# Patient Record
Sex: Male | Born: 1965 | Race: Black or African American | Hispanic: No | Marital: Single
Health system: Midwestern US, Community
[De-identification: ages and names within clinical notes are randomized; demographics above are authoritative.]

## PROBLEM LIST (undated history)

## (undated) DIAGNOSIS — D1803 Hemangioma of intra-abdominal structures: Secondary | ICD-10-CM

## (undated) DIAGNOSIS — K861 Other chronic pancreatitis: Secondary | ICD-10-CM

## (undated) DIAGNOSIS — Z86010 Personal history of colonic polyps: Secondary | ICD-10-CM

## (undated) DIAGNOSIS — K8689 Other specified diseases of pancreas: Secondary | ICD-10-CM

## (undated) HISTORY — DX: Other specified diseases of pancreas: K86.89

## (undated) HISTORY — DX: Personal history of colonic polyps: Z86.010

## (undated) HISTORY — PX: WISDOM TOOTH EXTRACTION: SHX21

## (undated) HISTORY — DX: Other chronic pancreatitis: K86.1

## (undated) HISTORY — DX: Hemangioma of intra-abdominal structures: D18.03

---

## 2007-10-14 ENCOUNTER — Emergency Department (HOSPITAL_COMMUNITY): Admission: EM | Admit: 2007-10-14 | Discharge: 2007-10-14 | Payer: Self-pay | Admitting: Emergency Medicine

## 2009-11-25 ENCOUNTER — Emergency Department (HOSPITAL_COMMUNITY): Admission: EM | Admit: 2009-11-25 | Discharge: 2009-11-25 | Payer: Self-pay | Admitting: Emergency Medicine

## 2009-11-27 ENCOUNTER — Emergency Department (HOSPITAL_COMMUNITY): Admission: EM | Admit: 2009-11-27 | Discharge: 2009-11-28 | Payer: Self-pay | Admitting: Emergency Medicine

## 2010-09-17 ENCOUNTER — Inpatient Hospital Stay (HOSPITAL_COMMUNITY): Payer: Managed Care, Other (non HMO)

## 2010-09-17 ENCOUNTER — Inpatient Hospital Stay (HOSPITAL_COMMUNITY)
Admission: EM | Admit: 2010-09-17 | Discharge: 2010-09-20 | DRG: 440 | Disposition: A | Discharge status: Home / Self Care | Payer: Managed Care, Other (non HMO) | Attending: Internal Medicine | Admitting: Internal Medicine

## 2010-09-17 DIAGNOSIS — D72829 Elevated white blood cell count, unspecified: Secondary | ICD-10-CM | POA: Diagnosis present

## 2010-09-17 DIAGNOSIS — R7309 Other abnormal glucose: Secondary | ICD-10-CM | POA: Diagnosis present

## 2010-09-17 DIAGNOSIS — K7689 Other specified diseases of liver: Secondary | ICD-10-CM | POA: Diagnosis present

## 2010-09-17 DIAGNOSIS — I10 Essential (primary) hypertension: Secondary | ICD-10-CM | POA: Diagnosis present

## 2010-09-17 DIAGNOSIS — K859 Acute pancreatitis without necrosis or infection, unspecified: Principal | ICD-10-CM | POA: Diagnosis present

## 2010-09-17 DIAGNOSIS — F1020 Alcohol dependence, uncomplicated: Secondary | ICD-10-CM | POA: Diagnosis present

## 2010-09-17 LAB — ETHANOL: Alcohol, Ethyl (B): <5 mg/dL (ref 0–10)

## 2010-09-17 LAB — DIFFERENTIAL
Eosinophils Absolute: 0.1 10*3/uL (ref 0.0–0.7)
Lymphocytes Relative: 19 % (ref 12–46)
Lymphs Abs: 2.8 10*3/uL (ref 0.7–4.0)
Monocytes Relative: 4 % (ref 3–12)
Neutro Abs: 11.4 10*3/uL — ABNORMAL HIGH (ref 1.7–7.7)

## 2010-09-17 LAB — CBC
Hemoglobin: 15.3 g/dL (ref 13.0–17.0)
MCH: 28.4 pg (ref 26.0–34.0)
RBC: 5.38 MIL/uL (ref 4.22–5.81)
RDW: 14.3 % (ref 11.5–15.5)

## 2010-09-17 LAB — COMPREHENSIVE METABOLIC PANEL
Calcium: 9.3 mg/dL (ref 8.4–10.5)
Chloride: 101 mEq/L (ref 96–112)
Creatinine, Ser: 1.04 mg/dL (ref 0.4–1.5)
GFR calc non Af Amer: >60 mL/min (ref >60)
Glucose, Bld: 169 mg/dL — ABNORMAL HIGH (ref 70–99)
Total Bilirubin: 0.8 mg/dL (ref 0.3–1.2)

## 2010-09-17 LAB — HEMOGLOBIN A1C
Hgb A1c MFr Bld: 5.4 % (ref <5.7)
Mean Plasma Glucose: 108 mg/dL (ref <117)

## 2010-09-18 LAB — COMPREHENSIVE METABOLIC PANEL
Alkaline Phosphatase: 72 U/L (ref 39–117)
Calcium: 8.8 mg/dL (ref 8.4–10.5)
GFR calc Af Amer: >60 mL/min (ref >60)
Potassium: 4.3 mEq/L (ref 3.5–5.1)

## 2010-09-18 LAB — CBC
HCT: 41.7 % (ref 39.0–52.0)
Hemoglobin: 13.9 g/dL (ref 13.0–17.0)
MCH: 27.8 pg (ref 26.0–34.0)
MCHC: 33.3 g/dL (ref 30.0–36.0)
Platelets: 208 10*3/uL (ref 150–400)
RBC: 5 MIL/uL (ref 4.22–5.81)
WBC: 14.8 10*3/uL — ABNORMAL HIGH (ref 4.0–10.5)

## 2010-09-18 LAB — LIPASE, BLOOD: Lipase: 609 U/L — ABNORMAL HIGH (ref 11–59)

## 2010-09-19 LAB — BASIC METABOLIC PANEL
Calcium: 8.7 mg/dL (ref 8.4–10.5)
Chloride: 104 mEq/L (ref 96–112)
Creatinine, Ser: 0.76 mg/dL (ref 0.4–1.5)
GFR calc Af Amer: >60 mL/min (ref >60)

## 2010-09-19 LAB — LIPASE, BLOOD: Lipase: 140 U/L — ABNORMAL HIGH (ref 11–59)

## 2010-09-19 NOTE — H&P (Signed)
NAME:  Joseph Hess, Joseph Hess NO.:  [id number]  MEDICAL RECORD NO.:  [id number]           PATIENT TYPE:  I  LOCATION:  1312                         FACILITY:  [facility]  PHYSICIAN:  Hillery Aldo, M.D.   DATE OF BIRTH:  1966-03-19  DATE OF ADMISSION:  09/17/2010 DATE OF DISCHARGE:                             HISTORY & PHYSICAL   PRIMARY CARE PHYSICIAN:  None.  CHIEF COMPLAINT:  Abdominal pain accompanied by nausea and vomiting.  HISTORY OF PRESENT ILLNESS:  The patient is a 45 year old male with no significant past medical history, but a history of heavy alcohol use who presents to the hospital with a chief complaint of intermittent abdominal pain.  The patient describes pain as being present for the past several weeks, but worsening in intensity and duration.  The pain is described as sharp and rated 10/10 at its worst.  No aggravating or alleviating factors; however, he has had some associated nausea and vomiting over the past 24 hours and thinks that eating may have aggravated the pain.  There is no frank hematemesis and no diarrhea.  He denies any fever or sick contacts.  He has no history of similar pain. Upon initial evaluation by the emergency department physician, the patient was found to have a lipase level of 350 and was subsequently referred to the hospitalist service for treatment of acute pancreatitis.  PAST MEDICAL HISTORY: 1. Traumatic fracture of nasal bone and maxilla, July 2011. 2. Traumatic boxer's fracture, right 5th finger, May 2009.  FAMILY HISTORY:  No family history of pancreatitis.  The patient's parents are both living.  Mother has hypertension and dyslipidemia.  The patient's father is diabetic.  SOCIAL HISTORY:  The patient is married and lives in [place] with his wife.  He smokes 5-6 cigarettes a day and admits to 10 beers per day on average.  He does also use marijuana, but denies any heavy drug use of other varieties.  He is  currently unemployed.  His wife can be reached at [phone] and his mother can be reached at [phone].  He indicates that these 2 individuals can make medical decisions on his behalf should he become incapacitated.  ALLERGIES:  PENICILLIN.  CURRENT MEDICATIONS:  Denies any current medications including herbs and over-the-counter supplements.  REVIEW OF SYSTEMS:  CONSTITUTIONAL:  Diminished appetite, but no weight changes.  HEENT:  No complaints.  CARDIOVASCULAR:  No chest pain or dysrhythmia.  RESPIRATORY:  No dyspnea or cough.  GI:  As noted in the elements of the HPI above.  No melena or hematochezia.  GU:  No dysuria or hematuria.  Comprehensive 14-point review of systems is otherwise unremarkable.  PHYSICAL EXAMINATION:  VITAL SIGNS:  Temperature 97.5, pulse 102, respirations 22, blood pressure 150/100, O2 saturation 100% on room air. GENERAL:  A well-developed, well-nourished African American male in no acute distress. HEENT:  Normocephalic, atraumatic.  PERRL.  EOMI.  Oropharynx is clear with metallic dental caps on his teeth.  Mucous membranes are moist. NECK:  Supple.  No thyromegaly, no lymphadenopathy, no jugular venous distention. CHEST:  Lungs clear to auscultation bilaterally with good air  movement. HEART:  Tachycardic rate, regular rhythm.  No murmurs, rubs, or gallops. ABDOMEN:  Soft, diffusely tender.  Bowel sounds present in all 4 quadrants. EXTREMITIES:  No clubbing, edema, or cyanosis. SKIN:  Warm and dry.  No rashes. NEUROLOGIC:  The patient is alert and oriented x3.  Cranial nerves II through XII are grossly intact.  Nonfocal.  LABORATORY DATA REVIEW:  Lipase is 350.  Alcohol was less than 5. Sodium is 136, potassium 3.6, chloride 101, bicarb 21, BUN 7, creatinine 1.04, glucose 169, calcium 9.3, total bilirubin 0.8, alkaline phosphatase 83, AST 65, ALT 49, total protein 7.8, albumin 4.3.  White blood cell count is 15, hemoglobin 15.3, hematocrit  44.7, platelets 295.  ASSESSMENT/PLAN: 1. Acute pancreatitis, most likely alcohol related, however, we will     rule out cholelithiasis with an abdominal ultrasound.  The patient     has been advised that this is most likely alcohol-related and that     he will need to cut down or completely discontinue alcohol use to     prevent this from recurring.  The patient will be made n.p.o.     except for ice chips and he has been explained that eating or     taking in caloric beverages is contraindicated at this time.  We     will provide him with pain medications and antiemetics and slowly     advance his diet once his pain is improved. 2. Alcoholism:  The patient will be placed on Ativan per the [facility]WA     protocol.  He denies any prior history of withdrawal type symptoms;     however, it is likely that he will go through some withdrawal,     given the amount of alcohol he consumes on a daily basis.  We will     supplement him with thiamine and folic acid. 3. Hyperglycemia, likely secondary to pancreatic dysfunction, but will     check hemoglobin A1c to ensure that he has no evidence of     underlying diabetes. 4. Leukocytosis, likely due to inflammation from pancreatitis.  We     will monitor his white blood cell count closely. 5. Hypertension:  Could be essential hypertension or effects of     beginning to withdraw from alcohol.  We will put him on p.o.     clonidine b.i.d. and monitor his response. 6. Prophylaxis:  The patient is ambulatory and at low risk for deep     venous thrombosis, so we will use PAS hoses for deep venous     thrombosis prophylaxis.  Time spent on admission including face-to-face time equals approximately 1 hour.     Hillery Aldo, M.D.     CR/MEDQ  D:  09/17/2010  T:  09/17/2010  Job:  [id number]  Electronically Signed by Hillery Aldo M.D. on 09/19/2010 07:04:42 AM

## 2010-09-20 LAB — CBC
HCT: 36.1 % — ABNORMAL LOW (ref 39.0–52.0)
Hemoglobin: 11.9 g/dL — ABNORMAL LOW (ref 13.0–17.0)
MCH: 27.5 pg (ref 26.0–34.0)
MCHC: 33 g/dL (ref 30.0–36.0)
MCV: 83.4 fL (ref 78.0–100.0)
Platelets: 164 10*3/uL (ref 150–400)
WBC: 12.4 10*3/uL — ABNORMAL HIGH (ref 4.0–10.5)

## 2010-09-20 LAB — COMPREHENSIVE METABOLIC PANEL
Alkaline Phosphatase: 100 U/L (ref 39–117)
BUN: 4 mg/dL — ABNORMAL LOW (ref 6–23)
Chloride: 104 mEq/L (ref 96–112)
Creatinine, Ser: 0.76 mg/dL (ref 0.4–1.5)
GFR calc non Af Amer: >60 mL/min (ref >60)
Potassium: 4 mEq/L (ref 3.5–5.1)
Sodium: 136 mEq/L (ref 135–145)
Total Bilirubin: 0.6 mg/dL (ref 0.3–1.2)

## 2010-09-21 NOTE — Discharge Summary (Signed)
NAME:  Joseph Hess, Joseph Hess NO.:  [id number]  MEDICAL RECORD NO.:  [id number]           PATIENT TYPE:  I  LOCATION:  1312                         FACILITY:  [facility]  PHYSICIAN:  Brendia Sacks, MD    DATE OF BIRTH:  17-Oct-1965  DATE OF ADMISSION:  09/17/2010 DATE OF DISCHARGE:  09/20/2010                              DISCHARGE SUMMARY   PRIMARY CARE PHYSICIAN:  None.  The patient will be establishing with primary care physician of his choice.  CONDITION ON DISCHARGE:  Improved.  DISCHARGE DIAGNOSES: 1. Acute alcohol-induced pancreatitis. 2. Alcohol abuse/dependency, stable.  No evidence of withdrawal. 3. Hepatic steatosis. 4. Hypertension, stable.  HISTORY OF PRESENT ILLNESS:  This is a 45 year old male who presented to the emergency room on September 17, 2010, with abdominal pain associated with nausea and vomiting who was admitted for treatment for acute pancreatitis.  HOSPITAL COURSE: 1. Acute alcohol-induced pancreatitis.  The patient was admitted to     the medical floor.  He was supportive care for his pancreatitis.     His symptoms have resolved at this point.  He has no abdominal     pain, no nausea or vomiting, and he is eating well.  He has not     required any narcotics in greater than 24 hours now.  His lipase is     trending down towards normal.  He appears to be stable for     discharge.  He will be discharged home on a heart-healthy bland     diet.  He has been counseled on the association of alcoholic     pancreatitis. 2. Alcohol abuse dependency.  I counseled him on complete cessation.     Certainly absolutely no more than 2 drinks in the 24-hour period.     He has also been seen by social work and has refused any additional     services.  He is thinking about maybe findings some counseling. 3. Hepatic steatosis.  This can be followed in the outpatient setting     as clinically indicated.  Hopefully he will improve off alcohol.     His  transaminases are within normal limits. 4. Hypertension.  This appears to be stable.  He has been started on     clonidine both for his hypertension and given his alcohol use.  At     this point, his blood pressure appears to be stable.  He does have     a history of hypertension which has been untreated.  At this point,     I think it is reasonable to continue him on clonidine and follow up     with primary care physician in the outpatient setting.  Should he     discontinue his alcohol use, could consider transitioning him to a     diuretic and a second agent as needed.  If the patient does     continue to work on alcohol cessation clonidine may be useful for     his blood pressure control in the interim as it appears on the     current dosing of  clonidine his blood pressure is stable.  CONSULTATIONS:  None.  PROCEDURES:  None.  IMAGING:  Abdominal ultrasound, September 17, 2010:  No cholelithiasis or choledocholithiasis identified.  No biliary dilatation.  Echogenic liver suggested hepatic steatosis.  Pancreas obscured by bowel gas potentially partially due to pancreatitis.  MICROBIOLOGY:  None.  PERTINENT LABORATORY STUDIES: 1. Serum alcohol on admission was negative. 2. Serum lipase on admission was 350. 3. Hemoglobin A1c was 5.4. 4. Discharge lipase is 99. 5. CBC notable for white blood cell count 15.0 on admission, trending     downwards and 12.4 on discharge.  Hemoglobin 11.9 on discharge.     His hemoglobin of 15.3 on admission was probably related to     hemoconcentration and dehydration.  The patient has had no evidence     of bleeding, his suspected baseline hemoglobin is around 12. 6. Basic metabolic panel unremarkable on discharge. 7. Hepatic function panel unremarkable on discharge.  He did have an     elevation of AST of 265 on admission consistent with alcohol use.     His ALT has remained within normal limits. 8. Lipase 350 on admission, 99 on  discharge.  DISCHARGE PHYSICAL EXAMINATION:  GENERAL:  The patient is feeling well. No nausea or vomiting.  He is eating well. VITAL SIGNS:  Temperature is 99.1, pulse 76, respirations 18, blood pressure 162/97, 100% on room air. CARDIOVASCULAR:  Regular rate and rhythm.  No murmur, rub, or gallop. RESPIRATORY SYSTEM:  Respirations are clear to auscultation bilaterally. No wheezes, rales or rhonchi. ABDOMEN:  Soft, nontender, nondistended.  The patient appears well.  DISCHARGE INSTRUCTIONS:  The patient will be discharged home today.  He should continue on a heart-healthy bland diet.  Activity is unrestricted.  I recommend he discontinue smoking and recommend complete alcohol cessation.  Absolutely no more than 2 alcoholic drinks in a 24- hour period.  I recommend Alcoholics Anonymous.  I recommended no illicit drug use.  To follow up with primary care physician of his choice in 2 weeks.  He has been counseled on connection of alcohol with pancreatitis.  Things to follow up in the outpatient setting: 1. Continue counseling on alcohol. 2. Continued recommendations for smoking cessation and currently     illicit drug. 3. Hypertension.  See discussion above.  Time coordinating discharge is 30 minutes.    Brendia Sacks, MD    DG/MEDQ  D:  09/20/2010  T:  09/20/2010  Job:  [id number]  Electronically Signed by Brendia Sacks  on 09/21/2010 05:55:09 PM

## 2011-03-05 ENCOUNTER — Emergency Department (HOSPITAL_COMMUNITY)
Admission: EM | Admit: 2011-03-05 | Discharge: 2011-03-06 | Disposition: A | Discharge status: Home / Self Care | Payer: Managed Care, Other (non HMO) | Attending: Emergency Medicine | Admitting: Emergency Medicine

## 2011-03-05 DIAGNOSIS — K859 Acute pancreatitis without necrosis or infection, unspecified: Secondary | ICD-10-CM | POA: Insufficient documentation

## 2011-03-05 DIAGNOSIS — F17200 Nicotine dependence, unspecified, uncomplicated: Secondary | ICD-10-CM | POA: Insufficient documentation

## 2011-03-05 DIAGNOSIS — F1010 Alcohol abuse, uncomplicated: Secondary | ICD-10-CM | POA: Insufficient documentation

## 2011-03-05 LAB — DIFFERENTIAL
Basophils Absolute: 0 10*3/uL (ref 0.0–0.1)
Eosinophils Absolute: 0 10*3/uL (ref 0.0–0.7)
Lymphs Abs: 1.1 10*3/uL (ref 0.7–4.0)
Neutro Abs: 14.9 10*3/uL — ABNORMAL HIGH (ref 1.7–7.7)
Neutrophils Relative %: 88 % — ABNORMAL HIGH (ref 43–77)

## 2011-03-05 LAB — CBC
Hemoglobin: 15.7 g/dL (ref 13.0–17.0)
MCH: 28.9 pg (ref 26.0–34.0)
MCHC: 34.8 g/dL (ref 30.0–36.0)
RDW: 14 % (ref 11.5–15.5)
WBC: 16.9 10*3/uL — ABNORMAL HIGH (ref 4.0–10.5)

## 2011-03-05 LAB — COMPREHENSIVE METABOLIC PANEL
ALT: 27 U/L (ref 0–53)
AST: 24 U/L (ref 0–37)
Albumin: 4.8 g/dL (ref 3.5–5.2)
BUN: 8 mg/dL (ref 6–23)
CO2: 26 mEq/L (ref 19–32)
Calcium: 10.5 mg/dL (ref 8.4–10.5)
Creatinine, Ser: 0.79 mg/dL (ref 0.50–1.35)
Glucose, Bld: 154 mg/dL — ABNORMAL HIGH (ref 70–99)
Sodium: 137 mEq/L (ref 135–145)

## 2011-03-05 LAB — LIPASE, BLOOD: Lipase: 579 U/L — ABNORMAL HIGH (ref 11–59)

## 2011-10-03 ENCOUNTER — Emergency Department (HOSPITAL_COMMUNITY)
Admission: EM | Admit: 2011-10-03 | Discharge: 2011-10-03 | Disposition: A | Discharge status: Home / Self Care | Payer: Self-pay | Attending: Emergency Medicine | Admitting: Emergency Medicine

## 2011-10-03 ENCOUNTER — Encounter (HOSPITAL_COMMUNITY): Payer: Self-pay | Admitting: Emergency Medicine

## 2011-10-03 DIAGNOSIS — R1110 Vomiting, unspecified: Secondary | ICD-10-CM | POA: Insufficient documentation

## 2011-10-03 DIAGNOSIS — M545 Low back pain, unspecified: Secondary | ICD-10-CM | POA: Insufficient documentation

## 2011-10-03 DIAGNOSIS — R1013 Epigastric pain: Secondary | ICD-10-CM | POA: Insufficient documentation

## 2011-10-03 DIAGNOSIS — K859 Acute pancreatitis without necrosis or infection, unspecified: Secondary | ICD-10-CM | POA: Insufficient documentation

## 2011-10-03 LAB — COMPREHENSIVE METABOLIC PANEL
ALT: 28 U/L (ref 0–53)
AST: 23 U/L (ref 0–37)
Alkaline Phosphatase: 95 U/L (ref 39–117)
CO2: 23 mEq/L (ref 19–32)
Chloride: 98 mEq/L (ref 96–112)
GFR calc non Af Amer: >90 mL/min (ref >90)
Glucose, Bld: 95 mg/dL (ref 70–99)
Sodium: 135 mEq/L (ref 135–145)
Total Bilirubin: 0.5 mg/dL (ref 0.3–1.2)

## 2011-10-03 LAB — URINALYSIS, ROUTINE W REFLEX MICROSCOPIC
Hgb urine dipstick: NEGATIVE
Ketones, ur: 40 mg/dL — AB
Nitrite: NEGATIVE
Specific Gravity, Urine: 1.026 (ref 1.005–1.030)
pH: 6 (ref 5.0–8.0)

## 2011-10-03 LAB — CBC
Hemoglobin: 15.9 g/dL (ref 13.0–17.0)
Platelets: 280 10*3/uL (ref 150–400)
RBC: 5.65 MIL/uL (ref 4.22–5.81)
WBC: 10.4 10*3/uL (ref 4.0–10.5)

## 2011-10-03 MED ORDER — PROMETHAZINE HCL 25 MG PO TABS: 25.0000 mg | ORAL_TABLET | Freq: Four times a day (QID) | ORAL | Status: DC | PRN

## 2011-10-03 MED ORDER — MORPHINE SULFATE 4 MG/ML IJ SOLN
4.0000 mg | Freq: Once | INTRAMUSCULAR | Status: AC
  Administered 2011-10-03: 4 mg via INTRAVENOUS
  Filled 2011-10-03: qty 1

## 2011-10-03 MED ORDER — SODIUM CHLORIDE 0.9 % IV BOLUS (SEPSIS)
1000.0000 mL | Freq: Once | INTRAVENOUS | Status: AC
  Administered 2011-10-03: 1000 mL via INTRAVENOUS

## 2011-10-03 MED ORDER — ONDANSETRON HCL 4 MG/2ML IJ SOLN
4.0000 mg | Freq: Once | INTRAMUSCULAR | Status: AC
  Administered 2011-10-03: 4 mg via INTRAVENOUS
  Filled 2011-10-03: qty 2

## 2011-10-03 MED ORDER — OXYCODONE-ACETAMINOPHEN 5-325 MG PO TABS: 1.0000 | ORAL_TABLET | ORAL | Status: AC | PRN

## 2011-10-03 NOTE — ED Provider Notes (Signed)
History     CSN: [id number]  Arrival date & time 10/03/11  1133   First MD Initiated Contact with Patient 10/03/11 1207      Chief Complaint  Patient presents with  . Back Pain    low back pain x 24 hrs  . Emesis     vomited x 4 in last 24 hrs    (Consider location/radiation/quality/duration/timing/severity/associated sxs/prior treatment) The history is provided by the patient.  46 y/o M with PMH heavy alcohol use, pancreatitis presents to ED with c/c of lower back pain and several episodes of emesis in the last 24 hours. Emesis non-bloody, non-bilious. Last episode around 7am today. No assoc abd pain. Back pain worst to the left paralumbar region with radiation to the left side. Loose, non-watery, non-bloody stool this am. Denies any dysuria, hematuria, or penile discharge. Denies fever, chills, CP, SOB. Movement makes back pain worse, nothing makes it better. No attempted tx PTA.  History reviewed. No pertinent past medical history.  History reviewed. No pertinent past surgical history.  Family History  Problem Relation Age of Onset  . Hypertension Mother   . Cancer Mother     History  Substance Use Topics  . Smoking status: Current Everyday Smoker    Types: Cigarettes  . Smokeless tobacco: Not on file  . Alcohol Use: Yes      Review of Systems 10 systems reviewed and are negative for acute change except as noted in the HPI.  Allergies  Penicillins  Home Medications   Current Outpatient Rx  Name Route Sig Dispense Refill  . ACETAMINOPHEN-ASPIRIN BUFFERED 250-250 MG PO TABS Oral Take 2 tablets by mouth every 4 (four) hours as needed. Back pain      BP 164/88  Pulse 99  Temp(Src) 98.9 F (37.2 C) (Oral)  SpO2 100%  Physical Exam  Constitutional: He appears well-developed and well-nourished.       Vital signs are reviewed and are normal except for HTN. Uncomfortable appearing.   HENT:  Head: Normocephalic and atraumatic.  Right Ear: External ear  normal.  Left Ear: External ear normal.       Oral mucosa moist  Eyes: Pupils are equal, round, and reactive to light.  Neck: Neck supple.  Cardiovascular: Normal rate, regular rhythm and normal heart sounds.   No murmur heard.      Bilateral radial and DP pulses are 2+   Pulmonary/Chest: Effort normal and breath sounds normal. No respiratory distress. He has no wheezes. He has no rales. He exhibits no tenderness.  Abdominal: Soft. Bowel sounds are normal. He exhibits no distension. There is tenderness in the epigastric area. There is no rebound, no guarding and no CVA tenderness.    Musculoskeletal: Normal range of motion. He exhibits tenderness. He exhibits no edema.       Cervical back: He exhibits no tenderness and no bony tenderness.       Thoracic back: He exhibits no tenderness and no bony tenderness.       Lumbar back: He exhibits no bony tenderness.       Back:  Neurological: He is alert.       MS appropriate for pt and situation. Speech clear. 5/5 and symmetric strength to BLE. Normal gait. Sensation intact to light touch and symmetric in BLE.  Skin: Skin is warm and dry. No rash noted.    ED Course  Procedures (including critical care time)  Labs Reviewed  URINALYSIS, ROUTINE W REFLEX MICROSCOPIC - Abnormal; Notable  for the following:    Bilirubin Urine SMALL (*)    Ketones, ur 40 (*)    All other components within normal limits  LIPASE, BLOOD - Abnormal; Notable for the following:    Lipase 243 (*)    All other components within normal limits  CBC  COMPREHENSIVE METABOLIC PANEL  ETHANOL   No results found.   Dx 1: Pancreatitis Dx 2: Low back pain   MDM  Exam sig for left paralumbar pain, epigastric pain. Labs reviewed, sig for elevated lipase, ketonuria. C/w pancreatitis and mild dehydration. Pt tx with single dose nausea, pain medication in ED and fluid bolus with near resolution of pain/nausea. Discussed admission vs d./c home for recovery. Stressed  importance of alcohol use cessation until symptoms completely resolved, pt reports not having any alcohol in last 24 hours without s/s of withdrawal and would rather tx self at home with pain and nausea medication. Regarding back pain, not c/w kidney stone and no hematuria. Exam most c/w muscular pain. Return precautions and all results discussed with pt and spouse, who voice understanding.        Shaaron Adler, PA-C 10/03/11 1529

## 2011-10-03 NOTE — ED Provider Notes (Signed)
Medical screening examination/treatment/procedure(s) were performed by non-physician practitioner and as supervising physician I was immediately available for consultation/collaboration. Devoria Albe, MD, FACEP   Ward Givens, MD 10/03/11 1535

## 2011-10-03 NOTE — Discharge Instructions (Signed)
Acute Pancreatitis The pancreas is a large gland located behind your stomach. It produces (secretes) enzymes. These enzymes help digest food. It also releases the hormones glucagon and insulin. These hormones help regulate blood sugar. When the pancreas becomes inflamed, the disease is called pancreatitis. Inflammation of the pancreas occurs when enzymes from the pancreas begin attacking and digesting the pancreas. CAUSES  Most cases ofsudden onset (acute) pancreatitis are caused by:  Alcohol abuse.   Gallstones.  Other less common causes are:  Some medications.   Exposure to certain chemicals   Infection.   Damage caused by an accident (trauma).   Surgery of the belly (abdomen).  SYMPTOMS  Acute pancreatitis usually begins with pain in the upper abdomen and may radiate to the back. This pain may last a couple days. The constant pain varies from mild to severe. The acute form of this disease may vary from mild, nonspecific abdominal pain to profound shock with coma. About 1 in 5 cases are severe. These patients become dehydrated and develop low blood pressure. In severe cases, bleeding into the pancreas can lead to shock and death. The lungs, heart, and kidneys may fail. DIAGNOSIS  Your caregiver will form a clinical opinion after giving you an exam. Laboratory work is used to confirm this diagnosis. Often,a digestive enzyme from the pancreas (serum amylase) and other enzymes are elevated. Sugars and fats (lipids) in the blood may be elevated. There may also be changes in the following levels: calcium, magnesium, potassium, chloride and bicarbonate (chemicals in the blood). X-rays, a CT scan, or ultrasound of your abdomen may be necessary to search for other causes of your abdominal pain. TREATMENT  Most pancreatitis requires treatment of symptoms. Most acute attacks last a couple of days. Your caregiver can discuss the treatment options with you.  If complications occur, hospitalization  may be necessary for pain control and intravenous (IV) fluid replacement.   Sometimes, a tube may be put into the stomach to control vomiting.   Food may not be allowed for 3 to 4 days. This gives the pancreas time to rest. Giving the pancreas a rest means there is no stimulation that would produce more enzymes and cause more damage.   Medicines (antibiotics) that kill germs may be given if infection is the cause.   Sometimes, surgery may be required.   Following an acute attack, your caregiver will determine the cause, if possible, and offer suggestions to prevent recurrences.  HOME CARE INSTRUCTIONS   Eat smaller, more frequent meals. This reduces the amount of digestive juices the pancreas produces.   Decrease the amount of fat in your diet. This may help reduce loose, diarrheal stools.   Drink enough water and fluids to keep your urine clear or pale yellow. This is to avoid dehydration which can cause increased pain.   Talk to your caregiver about pain relievers or other medicines that may help.   Avoid anything that may have triggered your pancreatitis (for example, alcohol).   Follow the diet advised by your caregiver. Do not advance the diet too soon.   Take medicines as prescribed.   Get plenty of rest.   Check your blood sugar at home as directed by your caregiver.   If your caregiver has given you a follow-up appointment, it is very important to keep that appointment. Not keeping the appointment could result in a lasting (chronic) or permanent injury, pain, and disability. If there is any problem keeping the appointment, you must call to reschedule.    SEEK MEDICAL CARE IF:   You are not recovering in the time described by your caregiver.   You have persistent pain, weakness, or feel sick to your stomach (nauseous).   You have recovered and then have another bout of pain.  SEEK IMMEDIATE MEDICAL CARE IF:   You are unable to eat or keep fluids down.   Your pain  increases a lot or changes.   You have an oral temperature above 102 F (38.9 C), not controlled by medicine.   Your skin or the white part of your eyes look yellow (jaundice).   You develop vomiting.   You feel dizzy or faint.   Your blood sugar is high (over 300).  MAKE SURE YOU:   Understand these instructions.   Will watch your condition.   Will get help right away if you are not doing well or get worse.  Document Released: 05/07/2005 Document Revised: 04/26/2011 Document Reviewed: 12/19/2007 [facility] Patient Information 2012 [place], [place].          Back Pain, Adult Low back pain is very common. About 1 in 5 people have back pain.The cause of low back pain is rarely dangerous. The pain often gets better over time.About half of people with a sudden onset of back pain feel better in just 2 weeks. About 8 in 10 people feel better by 6 weeks.  CAUSES Some common causes of back pain include:  Strain of the muscles or ligaments supporting the spine.   Wear and tear (degeneration) of the spinal discs.   Arthritis.   Direct injury to the back.  DIAGNOSIS Most of the time, the direct cause of low back pain is not known.However, back pain can be treated effectively even when the exact cause of the pain is unknown.Answering your caregiver's questions about your overall health and symptoms is one of the most accurate ways to make sure the cause of your pain is not dangerous. If your caregiver needs more information, he or she may order lab work or imaging tests (X-rays or MRIs).However, even if imaging tests show changes in your back, this usually does not require surgery. HOME CARE INSTRUCTIONS For many people, back pain returns.Since low back pain is rarely dangerous, it is often a condition that people can learn to [facility] their own.   Remain active. It is stressful on the back to sit or stand in one place. Do not sit, drive, or stand in one place for more than 30  minutes at a time. Take short walks on level surfaces as soon as pain allows.Try to increase the length of time you walk each day.   Do not stay in bed.Resting more than 1 or 2 days can delay your recovery.   Do not avoid exercise or work.Your body is made to move.It is not dangerous to be active, even though your back may hurt.Your back will likely heal faster if you return to being active before your pain is gone.   Pay attention to your body when you bend and lift. Many people have less discomfortwhen lifting if they bend their knees, keep the load close to their bodies,and avoid twisting. Often, the most comfortable positions are those that put less stress on your recovering back.   Find a comfortable position to sleep. Use a firm mattress and lie on your side with your knees slightly bent. If you lie on your back, put a pillow under your knees.   Only take over-the-counter or prescription medicines as directed  by your caregiver. Over-the-counter medicines to reduce pain and inflammation are often the most helpful.Your caregiver may prescribe muscle relaxant drugs.These medicines help dull your pain so you can more quickly return to your normal activities and healthy exercise.   Put ice on the injured area.   Put ice in a plastic bag.   Place a towel between your skin and the bag.   Leave the ice on for 15 to 20 minutes, 3 to 4 times a day for the first 2 to 3 days. After that, ice and heat may be alternated to reduce pain and spasms.   Ask your caregiver about trying back exercises and gentle massage. This may be of some benefit.   Avoid feeling anxious or stressed.Stress increases muscle tension and can worsen back pain.It is important to recognize when you are anxious or stressed and learn ways to manage it.Exercise is a great option.  SEEK MEDICAL CARE IF:  You have pain that is not relieved with rest or medicine.   You have pain that does not improve in 1 week.    You have new symptoms.   You are generally not feeling well.  SEEK IMMEDIATE MEDICAL CARE IF:   You have pain that radiates from your back into your legs.   You develop new bowel or bladder control problems.   You have unusual weakness or numbness in your arms or legs.   You develop nausea or vomiting.   You develop abdominal pain.   You feel faint.  Document Released: 05/07/2005 Document Revised: 04/26/2011 Document Reviewed: 09/25/2010 [facility] Patient Information 2012 [place], [place].

## 2011-10-03 NOTE — ED Notes (Signed)
Pt reports low back pain and vomiting x 24 hrs. Denies nausea at present

## 2011-10-03 NOTE — ED Notes (Signed)
Pt reports lower back pain and L flank pain that started yesterday at 0500.  Pt reports n/v and mild diarrhea early this am.  Pt reports drinking etoh daily.  Pt reports drinking "sparks" x 4 a day-16oz daily.  Pt has hx of pancreatitis.  Pt denies any urinary sxs at this time.

## 2011-11-29 ENCOUNTER — Emergency Department (HOSPITAL_COMMUNITY)
Admission: EM | Admit: 2011-11-29 | Discharge: 2011-11-29 | Disposition: A | Discharge status: Home / Self Care | Payer: BC Managed Care – PPO | Attending: Emergency Medicine | Admitting: Emergency Medicine

## 2011-11-29 ENCOUNTER — Emergency Department (HOSPITAL_COMMUNITY): Payer: BC Managed Care – PPO

## 2011-11-29 ENCOUNTER — Encounter (HOSPITAL_COMMUNITY): Payer: Self-pay | Admitting: *Deleted

## 2011-11-29 DIAGNOSIS — K859 Acute pancreatitis without necrosis or infection, unspecified: Secondary | ICD-10-CM

## 2011-11-29 DIAGNOSIS — F17200 Nicotine dependence, unspecified, uncomplicated: Secondary | ICD-10-CM | POA: Insufficient documentation

## 2011-11-29 LAB — COMPREHENSIVE METABOLIC PANEL
AST: 26 U/L (ref 0–37)
CO2: 22 mEq/L (ref 19–32)
Calcium: 9.5 mg/dL (ref 8.4–10.5)
Creatinine, Ser: 0.74 mg/dL (ref 0.50–1.35)
GFR calc Af Amer: >90 mL/min (ref >90)
GFR calc non Af Amer: >90 mL/min (ref >90)
Total Protein: 7.8 g/dL (ref 6.0–8.3)

## 2011-11-29 LAB — CBC WITH DIFFERENTIAL/PLATELET
Basophils Absolute: 0 10*3/uL (ref 0.0–0.1)
Eosinophils Absolute: 0 10*3/uL (ref 0.0–0.7)
Eosinophils Relative: 0 % (ref 0–5)
HCT: 43 % (ref 39.0–52.0)
Lymphocytes Relative: 9 % — ABNORMAL LOW (ref 12–46)
MCH: 27.9 pg (ref 26.0–34.0)
MCHC: 34 g/dL (ref 30.0–36.0)
MCV: 82.2 fL (ref 78.0–100.0)
Monocytes Absolute: 0.6 10*3/uL (ref 0.1–1.0)
RDW: 13.4 % (ref 11.5–15.5)

## 2011-11-29 LAB — OCCULT BLOOD, POC DEVICE: Fecal Occult Bld: NEGATIVE

## 2011-11-29 MED ORDER — ONDANSETRON HCL 4 MG PO TABS: 4.0000 mg | ORAL_TABLET | Freq: Four times a day (QID) | ORAL | Status: AC

## 2011-11-29 MED ORDER — GI COCKTAIL ~~LOC~~
30.0000 mL | Freq: Once | ORAL | Status: AC
  Administered 2011-11-29: 30 mL via ORAL
  Filled 2011-11-29: qty 30

## 2011-11-29 MED ORDER — ONDANSETRON HCL 4 MG/2ML IJ SOLN
4.0000 mg | Freq: Once | INTRAMUSCULAR | Status: AC
  Administered 2011-11-29: 4 mg via INTRAVENOUS
  Filled 2011-11-29: qty 2

## 2011-11-29 MED ORDER — HYDROMORPHONE HCL PF 1 MG/ML IJ SOLN
1.0000 mg | Freq: Once | INTRAMUSCULAR | Status: AC
  Administered 2011-11-29: 1 mg via INTRAVENOUS
  Filled 2011-11-29: qty 1

## 2011-11-29 MED ORDER — SODIUM CHLORIDE 0.9 % IV BOLUS (SEPSIS)
1000.0000 mL | Freq: Once | INTRAVENOUS | Status: AC
  Administered 2011-11-29: 1000 mL via INTRAVENOUS

## 2011-11-29 MED ORDER — HYDROCODONE-ACETAMINOPHEN 5-500 MG PO TABS: 1.0000 | ORAL_TABLET | Freq: Four times a day (QID) | ORAL | Status: AC | PRN

## 2011-11-29 NOTE — ED Notes (Addendum)
Pt states he has a hx of pancreatitis and has been throwing up blood since 11a today.  Reports left side abd pain since 4am.

## 2011-11-29 NOTE — ED Provider Notes (Signed)
History     CSN: [id number]  Arrival date & time 11/29/11  1331   First MD Initiated Contact with Patient 11/29/11 1414      Chief Complaint  Patient presents with  . Abdominal Pain    (Consider location/radiation/quality/duration/timing/severity/associated sxs/prior treatment) HPI  Patient presents to emergency department complaining of abdominal pain that he states is similar to his abdominal pain that he's had numerous times in the past associated with pancreatitis. He is also complaining of acute onset of vomiting up bright red blood. Patient states that he woke at 3 AM and vomited and woke again a few hours later and vomited however after the second occurrence of bright red blood mixed in the vomit. Patient states that he had gradual onset abdominal discomfort that began yesterday however has worsened throughout the night and has persisted today with severe pain. Patient states the pain is in his left upper quadrant and again consistent with his history of pancreatitis. Patient states that he did drink heavily on July 4th but did not drink since then. Patient states his pancreatitis is usually associated with his alcohol abuse. He denies any fevers, chills, chest pain, cough, hemoptysis, lower abdominal pain, diarrhea, or blood in his stool. Patient states that he takes medication for high blood pressure but takes no other medicines on regular basis. Patient is followed at [facility] as his primary care provider. Patient took nothing for symptoms prior to arrival. He denies aggravating or alleviating factors.  Past Medical History  Diagnosis Date  . Pancreatitis     History reviewed. No pertinent past surgical history.  Family History  Problem Relation Age of Onset  . Hypertension Mother   . Cancer Mother     History  Substance Use Topics  . Smoking status: Current Everyday Smoker    Types: Cigarettes  . Smokeless tobacco: Not on file  . Alcohol Use: No     pt  denies use in 2-3 months (11-29-11)      Review of Systems  All other systems reviewed and are negative.    Allergies  Penicillins  Home Medications   Current Outpatient Rx  Name Route Sig Dispense Refill  . IBUPROFEN 200 MG PO TABS Oral Take 400 mg by mouth every 6 (six) hours as needed. For pain      BP 166/98  Pulse 77  Temp 98.1 F (36.7 C) (Oral)  Resp 16  SpO2 99%  Physical Exam  Nursing note and vitals reviewed. Constitutional: He is oriented to person, place, and time. He appears well-developed and well-nourished. No distress.  HENT:  Head: Normocephalic and atraumatic.  Eyes: Conjunctivae are normal.  Neck: Normal range of motion. Neck supple.  Cardiovascular: Normal rate, regular rhythm, normal heart sounds and intact distal pulses.  Exam reveals no gallop and no friction rub.   No murmur heard. Pulmonary/Chest: Effort normal and breath sounds normal. No respiratory distress. He has no wheezes. He has no rales. He exhibits no tenderness.  Abdominal: Soft. Bowel sounds are normal. He exhibits no distension and no mass. There is tenderness. There is no rebound and no guarding.       TTP of LUQ but abdomen is soft and non rigid. No peritoneal signs.   Musculoskeletal: Normal range of motion. He exhibits no edema and no tenderness.  Neurological: He is alert and oriented to person, place, and time.  Skin: Skin is warm and dry. No rash noted. He is not diaphoretic. No erythema.  Psychiatric:  He has a normal mood and affect.    ED Course  Procedures (including critical care time)  IV dilaudid and zofran.   Labs Reviewed  CBC WITH DIFFERENTIAL - Abnormal; Notable for the following:    WBC 12.0 (*)     Neutrophils Relative 86 (*)     Neutro Abs 10.3 (*)     Lymphocytes Relative 9 (*)     All other components within normal limits  COMPREHENSIVE METABOLIC PANEL - Abnormal; Notable for the following:    Potassium 3.0 (*)     Glucose, Bld 120 (*)     All other  components within normal limits  LIPASE, BLOOD - Abnormal; Notable for the following:    Lipase 244 (*)     All other components within normal limits  OCCULT BLOOD, POC DEVICE  OCCULT BLOOD X 1 CARD TO LAB, STOOL   Dg Chest 2 View  11/29/2011  *RADIOLOGY REPORT*  Clinical Data: Mid chest pain.  CHEST - 2 VIEW  Comparison: None.  Findings: Trachea is midline.  Heart size normal.  Lungs are clear. No pleural fluid.  IMPRESSION: No acute findings.  Original Report Authenticated By: Reyes Ivan, M.D.     1. Pancreatitis     Patient has been having decreasing pain throughout ER stay with each dose of dilaudid and wants to have a PO challenge with another dose of dilaudid to make disposition. If patient begins to vomit or is intolerable to fluids then he will be admitted to hospital for pancreatitis but if tolerating fluids with sufficient pain control then will d/c home on clear liquid diet with pain meds and antiemetics and close follow up with PCP.   MDM  Patient is afebrile with a nonacute abdomen and is now tolerating fluids well. He does have pancreatitis but he has history of recurrent pancreatitis. He is drinking without difficulty with resolution of pain. Patient states that he would like to go home to followup with his primary care provider as needed. Atelectasis is appropriate at this time however didn't address changing or worsening symptoms that should prompt return to emergency department. Patient voices understanding and is agreeable plan.        Drucie Opitz, [place] 11/29/11 1922

## 2011-11-29 NOTE — ED Notes (Signed)
Dr.Cook at bedside to eval pt.

## 2011-11-29 NOTE — ED Notes (Signed)
Pt reports L sided abd pain since 4am today. Denies etoh use in one week. C/o n/v r/t pancreatitis.

## 2011-11-29 NOTE — ED Notes (Signed)
Patient transported to X-ray 

## 2011-12-01 NOTE — ED Provider Notes (Signed)
Medical screening examination/treatment/procedure(s) were conducted as a shared visit with non-physician practitioner(s) and myself.  I personally evaluated the patient during the encounter.. patient has no history of pancreatitis. Lipase is elevated. Patient tolerated fluids well. He wants to try to go home with pain medication. No acute abdomen  Donnetta Hutching, MD 12/01/11 [phone]

## 2012-05-10 DIAGNOSIS — R1013 Epigastric pain: Secondary | ICD-10-CM | POA: Insufficient documentation

## 2012-05-10 DIAGNOSIS — R112 Nausea with vomiting, unspecified: Secondary | ICD-10-CM | POA: Insufficient documentation

## 2012-05-10 DIAGNOSIS — F17200 Nicotine dependence, unspecified, uncomplicated: Secondary | ICD-10-CM | POA: Insufficient documentation

## 2012-05-11 ENCOUNTER — Encounter (HOSPITAL_COMMUNITY): Payer: Self-pay | Admitting: Emergency Medicine

## 2012-05-11 ENCOUNTER — Emergency Department (HOSPITAL_COMMUNITY)
Admission: EM | Admit: 2012-05-11 | Discharge: 2012-05-11 | Disposition: A | Discharge status: Home / Self Care | Payer: BC Managed Care – PPO | Attending: Emergency Medicine | Admitting: Emergency Medicine

## 2012-05-11 DIAGNOSIS — R109 Unspecified abdominal pain: Secondary | ICD-10-CM

## 2012-05-11 LAB — COMPREHENSIVE METABOLIC PANEL
AST: 27 U/L (ref 0–37)
Albumin: 4.8 g/dL (ref 3.5–5.2)
Alkaline Phosphatase: 85 U/L (ref 39–117)
Chloride: 96 mEq/L (ref 96–112)
Potassium: 3.4 mEq/L — ABNORMAL LOW (ref 3.5–5.1)
Total Bilirubin: 0.7 mg/dL (ref 0.3–1.2)

## 2012-05-11 LAB — CBC
HCT: 47.5 % (ref 39.0–52.0)
Platelets: 263 10*3/uL (ref 150–400)
RDW: 13.6 % (ref 11.5–15.5)
WBC: 8.2 10*3/uL (ref 4.0–10.5)

## 2012-05-11 MED ORDER — HYDROMORPHONE HCL PF 1 MG/ML IJ SOLN
1.0000 mg | Freq: Once | INTRAMUSCULAR | Status: AC
  Administered 2012-05-11: 1 mg via INTRAVENOUS
  Filled 2012-05-11: qty 1

## 2012-05-11 MED ORDER — OXYCODONE-ACETAMINOPHEN 5-325 MG PO TABS: 1.0000 | ORAL_TABLET | ORAL | Status: DC | PRN

## 2012-05-11 MED ORDER — ONDANSETRON HCL 4 MG/2ML IJ SOLN
4.0000 mg | Freq: Once | INTRAMUSCULAR | Status: AC
  Administered 2012-05-11: 4 mg via INTRAVENOUS
  Filled 2012-05-11: qty 2

## 2012-05-11 MED ORDER — ONDANSETRON 8 MG PO TBDP: 8.0000 mg | ORAL_TABLET | Freq: Three times a day (TID) | ORAL | Status: DC | PRN

## 2012-05-11 MED ORDER — SODIUM CHLORIDE 0.9 % IV SOLN
1000.0000 mL | INTRAVENOUS | Status: DC
  Administered 2012-05-11: 1000 mL via INTRAVENOUS

## 2012-05-11 MED ORDER — SODIUM CHLORIDE 0.9 % IV SOLN
1000.0000 mL | Freq: Once | INTRAVENOUS | Status: AC
  Administered 2012-05-11: 1000 mL via INTRAVENOUS

## 2012-05-11 NOTE — ED Provider Notes (Signed)
History     CSN: [id number]  Arrival date & time 05/10/12  2335   First MD Initiated Contact with Patient 05/11/12 0056      Chief Complaint  Patient presents with  . Pancreatitis    The history is provided by the patient.   patient reports a history of pancreatitis.  He reports being she going send drinking champagne several days for his birthday and since then he's had epigastric pain nausea and vomiting.  His had decreased oral intake today.  He denies fevers or chills.  Denies diarrhea.  No dysuria or urinary frequency.  No hematemesis.  He states that normally he doesn't drink alcohol any longer.  Past Medical History  Diagnosis Date  . Pancreatitis     History reviewed. No pertinent past surgical history.  Family History  Problem Relation Age of Onset  . Hypertension Mother   . Cancer Mother     History  Substance Use Topics  . Smoking status: Current Every Day Smoker    Types: Cigarettes  . Smokeless tobacco: Not on file  . Alcohol Use: No     Comment: pt denies use in 2-3 months (11-29-11)      Review of Systems  All other systems reviewed and are negative.    Allergies  Penicillins  Home Medications   Current Outpatient Rx  Name  Route  Sig  Dispense  Refill  . IBUPROFEN 200 MG PO TABS   Oral   Take 400-600 mg by mouth every 6 (six) hours as needed. For pain         . ONDANSETRON 8 MG PO TBDP   Oral   Take 1 tablet (8 mg total) by mouth every 8 (eight) hours as needed for nausea.   12 tablet   0   . OXYCODONE-ACETAMINOPHEN 5-325 MG PO TABS   Oral   Take 1 tablet by mouth every 4 (four) hours as needed for pain.   12 tablet   0     BP 143/97  Pulse 72  Temp 98 F (36.7 C) (Oral)  Resp 16  SpO2 98%  Physical Exam  Nursing note and vitals reviewed. Constitutional: He is oriented to person, place, and time. He appears well-developed and well-nourished.  HENT:  Head: Normocephalic and atraumatic.  Eyes: EOM are normal.  Neck:  Normal range of motion.  Cardiovascular: Normal rate, regular rhythm, normal heart sounds and intact distal pulses.   Pulmonary/Chest: Effort normal and breath sounds normal. No respiratory distress.  Abdominal: Soft. He exhibits no distension.       Mild epigastric discomfort without guarding or rebound.  Musculoskeletal: Normal range of motion.  Neurological: He is alert and oriented to person, place, and time.  Skin: Skin is warm and dry.  Psychiatric: He has a normal mood and affect. Judgment normal.    ED Course  Procedures (including critical care time)  Labs Reviewed  CBC - Abnormal; Notable for the following:    RBC 5.96 (*)     All other components within normal limits  COMPREHENSIVE METABOLIC PANEL - Abnormal; Notable for the following:    Potassium 3.4 (*)     Glucose, Bld 114 (*)     Calcium 10.7 (*)     Total Protein 8.7 (*)     All other components within normal limits  LIPASE, BLOOD   No results found.   1. Abdominal pain       MDM  5:44 AM The patient feels  much better at this time.  He'll be discharged home in good condition.  Home with nausea medicine and pain medicine.  Instructed to stop drinking alcohol.  He understands to return to the ER for new or worsening symptoms        Lyanne Co, MD 05/11/12 [phone]

## 2012-05-11 NOTE — ED Notes (Signed)
Pt presents to the ED with a history of pancreatitis.  Pt.  Ate spicy chicken wings and champagne a week ago and has been vomiting since Thursday.

## 2012-05-11 NOTE — ED Notes (Signed)
Pt states that he drank two bottle of champagne and ate spicy chicken wings on his birthday last week. Tuesday this week he began vomiting and has not eaten since.

## 2012-07-22 ENCOUNTER — Encounter (HOSPITAL_COMMUNITY): Payer: Self-pay

## 2012-07-22 ENCOUNTER — Emergency Department (HOSPITAL_COMMUNITY): Payer: BC Managed Care – PPO

## 2012-07-22 ENCOUNTER — Emergency Department (HOSPITAL_COMMUNITY)
Admission: EM | Admit: 2012-07-22 | Discharge: 2012-07-23 | Disposition: A | Discharge status: Home / Self Care | Payer: BC Managed Care – PPO | Attending: Emergency Medicine | Admitting: Emergency Medicine

## 2012-07-22 DIAGNOSIS — F17200 Nicotine dependence, unspecified, uncomplicated: Secondary | ICD-10-CM | POA: Insufficient documentation

## 2012-07-22 DIAGNOSIS — Z8719 Personal history of other diseases of the digestive system: Secondary | ICD-10-CM | POA: Insufficient documentation

## 2012-07-22 DIAGNOSIS — R112 Nausea with vomiting, unspecified: Secondary | ICD-10-CM | POA: Insufficient documentation

## 2012-07-22 DIAGNOSIS — IMO0001 Reserved for inherently not codable concepts without codable children: Secondary | ICD-10-CM | POA: Insufficient documentation

## 2012-07-22 DIAGNOSIS — R109 Unspecified abdominal pain: Secondary | ICD-10-CM

## 2012-07-22 DIAGNOSIS — R42 Dizziness and giddiness: Secondary | ICD-10-CM | POA: Insufficient documentation

## 2012-07-22 DIAGNOSIS — R5381 Other malaise: Secondary | ICD-10-CM | POA: Insufficient documentation

## 2012-07-22 DIAGNOSIS — R509 Fever, unspecified: Secondary | ICD-10-CM | POA: Insufficient documentation

## 2012-07-22 LAB — COMPREHENSIVE METABOLIC PANEL
AST: 17 U/L (ref 0–37)
CO2: 23 mEq/L (ref 19–32)
Calcium: 9.8 mg/dL (ref 8.4–10.5)
Creatinine, Ser: 0.9 mg/dL (ref 0.50–1.35)
GFR calc Af Amer: >90 mL/min (ref >90)
GFR calc non Af Amer: >90 mL/min (ref >90)
Glucose, Bld: 116 mg/dL — ABNORMAL HIGH (ref 70–99)

## 2012-07-22 LAB — CBC WITH DIFFERENTIAL/PLATELET
Basophils Absolute: 0 10*3/uL (ref 0.0–0.1)
Eosinophils Relative: 0 % (ref 0–5)
HCT: 45.3 % (ref 39.0–52.0)
Lymphocytes Relative: 21 % (ref 12–46)
MCH: 28.5 pg (ref 26.0–34.0)
MCV: 81.6 fL (ref 78.0–100.0)
Monocytes Absolute: 0.4 10*3/uL (ref 0.1–1.0)
RDW: 13.8 % (ref 11.5–15.5)
WBC: 7.9 10*3/uL (ref 4.0–10.5)

## 2012-07-22 LAB — LIPASE, BLOOD: Lipase: 66 U/L — ABNORMAL HIGH (ref 11–59)

## 2012-07-22 LAB — URINALYSIS, ROUTINE W REFLEX MICROSCOPIC
Glucose, UA: NEGATIVE mg/dL
Ketones, ur: 15 mg/dL — AB
Protein, ur: 100 mg/dL — AB

## 2012-07-22 MED ORDER — HYDROMORPHONE HCL PF 1 MG/ML IJ SOLN
1.0000 mg | Freq: Once | INTRAMUSCULAR | Status: AC
  Administered 2012-07-22: 1 mg via INTRAVENOUS
  Filled 2012-07-22: qty 1

## 2012-07-22 MED ORDER — SODIUM CHLORIDE 0.9 % IV BOLUS (SEPSIS)
1000.0000 mL | Freq: Once | INTRAVENOUS | Status: AC
  Administered 2012-07-22: 1000 mL via INTRAVENOUS

## 2012-07-22 MED ORDER — LORAZEPAM 2 MG/ML IJ SOLN
0.5000 mg | Freq: Once | INTRAMUSCULAR | Status: AC
  Administered 2012-07-22: 0.5 mg via INTRAVENOUS
  Filled 2012-07-22: qty 1

## 2012-07-22 MED ORDER — ONDANSETRON HCL 4 MG/2ML IJ SOLN
4.0000 mg | Freq: Once | INTRAMUSCULAR | Status: AC
  Administered 2012-07-22: 4 mg via INTRAVENOUS
  Filled 2012-07-22: qty 2

## 2012-07-22 NOTE — ED Provider Notes (Signed)
History     CSN: [id number]  Arrival date & time 07/22/12  7829   First MD Initiated Contact with Patient 07/22/12 2052      Chief Complaint  Patient presents with  . Emesis  . Back Pain    (Consider location/radiation/quality/duration/timing/severity/associated sxs/prior treatment) The history is provided by the patient and medical records. No language interpreter was used.   Joseph Hess is a 47 year old male with past medical history of pancreatitis who presents today with chief complaint of abdominal pain nausea and vomiting.  Patient states that he developed "flulike symptoms" this past Saturday, 3 days ago.  Patient has had chills, myalgias, subjective fever at home.  The patient complains of epigastric abdominal pain worse in the left upper quadrant, double episodes of vomiting and nausea.  The patient states he has not had any bowel movement since the onset of his illness.  He has been unable to hold down any food or liquids.  Patient states that he does feel weak and dizzy.  Patient denies any recent alcohol use, however he was eating a lot of spicy foods before his illness began.  Patient currently rates his pain as 10 out of 10.  He denies any hematemesis or bilious vomitus.   Past Medical History  Diagnosis Date  . Pancreatitis     History reviewed. No pertinent past surgical history.  Family History  Problem Relation Age of Onset  . Hypertension Mother   . Cancer Mother     History  Substance Use Topics  . Smoking status: Current Every Day Smoker    Types: Cigarettes  . Smokeless tobacco: Not on file  . Alcohol Use: No     Comment: pt denies use in 2-3 months (11-29-11)      Review of Systems Ten systems reviewed and are negative for acute change, except as noted in the HPI.   Allergies  Penicillins  Home Medications   Current Outpatient Rx  Name  Route  Sig  Dispense  Refill  . ibuprofen (ADVIL,MOTRIN) 200 MG tablet   Oral   Take 400-600 mg by  mouth every 6 (six) hours as needed. For pain         . Pseudoeph-Doxylamine-DM-APAP (NYQUIL) 60-7.10-17-998 MG/30ML LIQD   Oral   Take 15 mLs by mouth as needed (cold symptons).           BP 148/112  Pulse 104  Temp(Src) 98.6 F (37 C) (Oral)  Resp 18  SpO2 100%  Physical Exam Nursing note and vitals reviewed. Constitutional: A thin male. No distress.  appears uncomfortable  HENT:  Head: Normocephalic and atraumatic.  Eyes: Conjunctivae normal are normal. No scleral icterus.  Neck: Normal range of motion. Neck supple.  Cardiovascular: Normal rate, regular rhythm and normal heart sounds.   Pulmonary/Chest: Effort normal and breath sounds normal. No respiratory distress.  Abdominal: Soft.  Scaphoid abdomen.  No distention.  Exam is limited due to voluntary guarding.  EXQUISITELY ttp Musculoskeletal: He exhibits no edema.  Neurological: He is alert.  Skin: Skin is warm and dry. He is not diaphoretic.  Psychiatric: His behavior is normal.      ED Course  Procedures (including critical care time)  Labs Reviewed  URINALYSIS, ROUTINE W REFLEX MICROSCOPIC - Abnormal; Notable for the following:    Color, Urine AMBER (*)    APPearance CLOUDY (*)    Specific Gravity, Urine 1.037 (*)    Hgb urine dipstick SMALL (*)    Bilirubin Urine SMALL (*)  Ketones, ur 15 (*)    Protein, ur 100 (*)    All other components within normal limits  COMPREHENSIVE METABOLIC PANEL - Abnormal; Notable for the following:    Potassium 3.3 (*)    Glucose, Bld 116 (*)    All other components within normal limits  CBC WITH DIFFERENTIAL  URINE MICROSCOPIC-ADD ON  LIPASE, BLOOD   No results found.   No diagnosis found.    MDM  9:24 PM BP 148/112  Pulse 104  Temp(Src) 98.6 F (37 C) (Oral)  Resp 18  SpO2 100% Patient with clinical signs of dehydration.  He is tachycardic and hypertensive.  I suspect this is deep to pain and dehydration.  Patient may have recurrent bout of  pancreatitis versus gastritis.  He he does not appear to be intoxicated caterer smell of alcohol.   Ordered acute abdominal x-ray DT patient complained of inability to make a bowel movement in the past few days.  This may just be due to the fact he hasn't been unable to eat or hold down any liquids.  He has history of abdominal surgeries.   Labs pending  11:10 PM BP 148/112  Pulse 104  Temp(Src) 98.6 F (37 C) (Oral)  Resp 18  SpO2 100% Patient with elevated lipase.  He continues to have pain/ will redose pain meds. Patient appears uncomfortable.    Filed Vitals:   07/22/12 1941 07/22/12 2330  BP: 148/112 149/93  Pulse: 104 61  Temp: 98.6 F (37 C)   TempSrc: Oral   Resp: 18   SpO2: 100% 99%   Patient sxs have resolved.  He is keeping fluids down aand feels safe to leave.  L:ipase < 100 and patient does not require admission. Patient is nontoxic, nonseptic appearing, in no apparent distress.  Patient's pain and other symptoms adequately managed in emergency department.  Fluid bolus given.  Labs, imaging and vitals reviewed.  Patient does not meet the SIRS or Sepsis criteria.  On repeat exam patient does not have a surgical abdomin and there are nor peritoneal signs.  No indication of appendicitis, bowel obstruction, bowel perforation, cholecystitis, diverticulitis, .  Patient discharged home with symptomatic treatment and given strict instructions for follow-up with their primary care physician.  I have also discussed reasons to return immediately to the ER.  Patient expresses understanding and agrees with plan.      Arthor Captain, PA-C 07/25/12 1043

## 2012-07-22 NOTE — ED Notes (Signed)
Pt states that he had flu like symptoms and now complains of vomiting and low back pain since Sunday

## 2012-07-23 MED ORDER — HYDROMORPHONE HCL PF 1 MG/ML IJ SOLN: 0.5000 mg | Freq: Once | INTRAMUSCULAR | Status: DC

## 2012-07-23 MED ORDER — SODIUM CHLORIDE 0.9 % IV BOLUS (SEPSIS)
500.0000 mL | Freq: Once | INTRAVENOUS | Status: AC
  Administered 2012-07-23: 500 mL via INTRAVENOUS

## 2012-07-23 MED ORDER — PROMETHAZINE HCL 25 MG PO TABS: 25.0000 mg | ORAL_TABLET | Freq: Four times a day (QID) | ORAL | Status: DC | PRN

## 2012-07-23 MED ORDER — HYDROMORPHONE HCL PF 1 MG/ML IJ SOLN
1.0000 mg | Freq: Once | INTRAMUSCULAR | Status: AC
  Administered 2012-07-23: 1 mg via INTRAVENOUS
  Filled 2012-07-23: qty 1

## 2012-07-23 MED ORDER — OXYCODONE-ACETAMINOPHEN 5-325 MG PO TABS: 1.0000 | ORAL_TABLET | ORAL | Status: DC | PRN

## 2012-07-23 NOTE — ED Notes (Signed)
Pt given water for PO challenge 

## 2012-07-26 NOTE — ED Provider Notes (Signed)
Medical screening examination/treatment/procedure(s) were performed by non-physician practitioner and as supervising physician I was immediately available for consultation/collaboration.   Loren Racer, MD 07/26/12 [phone]

## 2012-09-29 ENCOUNTER — Encounter (HOSPITAL_COMMUNITY): Payer: Self-pay | Admitting: Emergency Medicine

## 2012-09-29 ENCOUNTER — Emergency Department (HOSPITAL_COMMUNITY)
Admission: EM | Admit: 2012-09-29 | Discharge: 2012-09-29 | Disposition: A | Discharge status: Home / Self Care | Payer: BC Managed Care – PPO | Attending: Emergency Medicine | Admitting: Emergency Medicine

## 2012-09-29 DIAGNOSIS — F17200 Nicotine dependence, unspecified, uncomplicated: Secondary | ICD-10-CM | POA: Insufficient documentation

## 2012-09-29 DIAGNOSIS — Z880 Allergy status to penicillin: Secondary | ICD-10-CM | POA: Insufficient documentation

## 2012-09-29 DIAGNOSIS — R1012 Left upper quadrant pain: Secondary | ICD-10-CM | POA: Insufficient documentation

## 2012-09-29 DIAGNOSIS — K852 Alcohol induced acute pancreatitis without necrosis or infection: Secondary | ICD-10-CM

## 2012-09-29 DIAGNOSIS — K859 Acute pancreatitis without necrosis or infection, unspecified: Secondary | ICD-10-CM | POA: Insufficient documentation

## 2012-09-29 LAB — COMPREHENSIVE METABOLIC PANEL
AST: 21 U/L (ref 0–37)
Albumin: 4.1 g/dL (ref 3.5–5.2)
Chloride: 103 mEq/L (ref 96–112)
Creatinine, Ser: 0.76 mg/dL (ref 0.50–1.35)
Potassium: 3.8 mEq/L (ref 3.5–5.1)
Total Bilirubin: 0.2 mg/dL — ABNORMAL LOW (ref 0.3–1.2)
Total Protein: 7.4 g/dL (ref 6.0–8.3)

## 2012-09-29 LAB — URINALYSIS, ROUTINE W REFLEX MICROSCOPIC
Ketones, ur: NEGATIVE mg/dL
Leukocytes, UA: NEGATIVE
Nitrite: NEGATIVE
Protein, ur: NEGATIVE mg/dL
Urobilinogen, UA: 0.2 mg/dL (ref 0.0–1.0)

## 2012-09-29 LAB — CBC WITH DIFFERENTIAL/PLATELET
Basophils Absolute: 0 10*3/uL (ref 0.0–0.1)
Basophils Relative: 0 % (ref 0–1)
MCHC: 35.4 g/dL (ref 30.0–36.0)
Neutro Abs: 7.5 10*3/uL (ref 1.7–7.7)
Neutrophils Relative %: 81 % — ABNORMAL HIGH (ref 43–77)
RDW: 13.6 % (ref 11.5–15.5)

## 2012-09-29 MED ORDER — SODIUM CHLORIDE 0.9 % IV BOLUS (SEPSIS)
1000.0000 mL | Freq: Once | INTRAVENOUS | Status: AC
  Administered 2012-09-29: 1000 mL via INTRAVENOUS

## 2012-09-29 MED ORDER — PROMETHAZINE HCL 25 MG/ML IJ SOLN
12.5000 mg | Freq: Once | INTRAMUSCULAR | Status: AC
  Administered 2012-09-29: 12.5 mg via INTRAVENOUS
  Filled 2012-09-29: qty 1

## 2012-09-29 MED ORDER — HYDROMORPHONE HCL PF 1 MG/ML IJ SOLN
1.0000 mg | Freq: Once | INTRAMUSCULAR | Status: AC
  Administered 2012-09-29: 1 mg via INTRAVENOUS
  Filled 2012-09-29: qty 1

## 2012-09-29 MED ORDER — PROMETHAZINE HCL 25 MG PO TABS: 25.0000 mg | ORAL_TABLET | Freq: Four times a day (QID) | ORAL | Status: DC | PRN

## 2012-09-29 MED ORDER — HYDROCODONE-ACETAMINOPHEN 5-325 MG PO TABS: ORAL_TABLET | ORAL | Status: DC

## 2012-09-29 MED ORDER — ONDANSETRON 8 MG PO TBDP
8.0000 mg | ORAL_TABLET | Freq: Once | ORAL | Status: AC
  Administered 2012-09-29: 8 mg via ORAL
  Filled 2012-09-29: qty 1

## 2012-09-29 MED ORDER — ONDANSETRON HCL 4 MG/2ML IJ SOLN
4.0000 mg | Freq: Once | INTRAMUSCULAR | Status: AC
  Administered 2012-09-29: 4 mg via INTRAVENOUS
  Filled 2012-09-29: qty 2

## 2012-09-29 NOTE — ED Notes (Signed)
Pt sts he drank half the cup of Ginger Ale and tolerated it well.

## 2012-09-29 NOTE — ED Provider Notes (Addendum)
History     CSN: [id number]  Arrival date & time 09/29/12  1022   First MD Initiated Contact with Patient 09/29/12 1306      Chief Complaint  Patient presents with  . Emesis    (Consider location/radiation/quality/duration/timing/severity/associated sxs/prior treatment) Patient is a 47 y.o. male presenting with vomiting. The history is provided by the patient.  Emesis Severity:  Severe Duration:  6 hours Timing:  Constant Number of daily episodes:  20 Quality:  Stomach contents Progression:  Unchanged Chronicity:  Recurrent Recent urination:  Normal Relieved by:  Nothing Worsened by:  Liquids Associated symptoms: abdominal pain   Associated symptoms: no chills, no cough, no diarrhea and no fever   Abdominal pain:    Location:  LUQ   Quality:  Stabbing, sharp and shooting   Severity:  Severe   Onset quality:  Gradual   Duration:  6 hours   Timing:  Constant   Progression:  Worsening   Chronicity:  Recurrent Risk factors: alcohol use   Risk factors comment:  Prior pancreatitis with alcohol use   Past Medical History  Diagnosis Date  . Pancreatitis     History reviewed. No pertinent past surgical history.  Family History  Problem Relation Age of Onset  . Hypertension Mother   . Cancer Mother     History  Substance Use Topics  . Smoking status: Current Every Day Smoker    Types: Cigarettes  . Smokeless tobacco: Not on file  . Alcohol Use: Yes     Comment: pt denies use in 2-3 months (11-29-11)      Review of Systems  Constitutional: Negative for chills.  Gastrointestinal: Positive for vomiting and abdominal pain. Negative for diarrhea.  All other systems reviewed and are negative.    Allergies  Penicillins  Home Medications  No current outpatient prescriptions on file.  BP 170/98  Pulse 63  Temp(Src) 98.1 F (36.7 C) (Oral)  Resp 16  SpO2 100%  Physical Exam  Nursing note and vitals reviewed. Constitutional: He is oriented to person,  place, and time. He appears well-developed and well-nourished. He appears distressed.  HENT:  Head: Normocephalic and atraumatic.  Mouth/Throat: Oropharynx is clear and moist.  Eyes: Conjunctivae and EOM are normal. Pupils are equal, round, and reactive to light.  Neck: Normal range of motion. Neck supple.  Cardiovascular: Normal rate, regular rhythm and intact distal pulses.   No murmur heard. Pulmonary/Chest: Effort normal and breath sounds normal. No respiratory distress. He has no wheezes. He has no rales.  Abdominal: Soft. He exhibits no distension. There is tenderness in the left upper quadrant. There is guarding. There is no rebound and no CVA tenderness.  Musculoskeletal: Normal range of motion. He exhibits no edema and no tenderness.  Neurological: He is alert and oriented to person, place, and time.  Skin: Skin is warm and dry. No rash noted. No erythema.  Psychiatric: He has a normal mood and affect. His behavior is normal.    ED Course  Procedures (including critical care time)  Labs Reviewed  URINALYSIS, ROUTINE W REFLEX MICROSCOPIC - Abnormal; Notable for the following:    Glucose, UA 100 (*)    Hgb urine dipstick SMALL (*)    All other components within normal limits  CBC WITH DIFFERENTIAL - Abnormal; Notable for the following:    Neutrophils Relative 81 (*)    Monocytes Relative 2 (*)    All other components within normal limits  COMPREHENSIVE METABOLIC PANEL - Abnormal; Notable  for the following:    Glucose, Bld 124 (*)    Total Bilirubin 0.2 (*)    All other components within normal limits  LIPASE, BLOOD - Abnormal; Notable for the following:    Lipase 134 (*)    All other components within normal limits  URINE MICROSCOPIC-ADD ON   No results found.   1. Acute alcoholic pancreatitis       MDM   Patient with a history of alcoholic pancreatitis who comes in today complaining of abdominal pain and vomiting started this morning. He states he drank 2 16oz  beers yesterday.  He has a left upper quadrant abdominal pain and appears uncomfortable on exam but is hemodynamically stable. He denies any infectious symptoms and no diarrhea. His LFTs are within normal limits but lipase today comes back elevated at 134.  This appears to be alcoholic pancreatitis.  We'll hydrate and give nausea and pain control.  2:52 PM On re-eval moderate pain improvement but still having pain.  Will give second dose and attempt to po challenge.  4:18 PM Pain improved.  Po challenge and pt checked out to Dr. Oletta Lamas.    Gwyneth Sprout, MD 09/29/12 1452  Gwyneth Sprout, MD 09/29/12 1610  Gwyneth Sprout, MD 09/29/12 1643

## 2012-09-29 NOTE — ED Notes (Signed)
Pt complains of abdominal pain and vomiting x 1 day

## 2012-09-29 NOTE — ED Notes (Signed)
Checked to see if Pt was able to tolerate fluid.  Woke Pt as I opened the door.  Pt sts "I haven't really tried to drink it."  Asked Pt to continue to take sips.

## 2012-09-29 NOTE — ED Notes (Addendum)
Pt c/o LUQ abdominal pain.  Hx pancreatitis.  Sts it feels like a flare up.  Sts ETOH use yesterday.

## 2012-09-29 NOTE — ED Notes (Signed)
Pt fell asleep during medication administration.

## 2012-09-29 NOTE — Discharge Instructions (Signed)
 Acute Pancreatitis Acute pancreatitis is a disease in which the pancreas becomes suddenly inflamed. The pancreas is a large gland located behind your stomach. The pancreas produces enzymes that help digest food. The pancreas also releases the hormones glucagon and insulin that help regulate blood sugar. Damage to the pancreas occurs when the digestive enzymes from the pancreas are activated and begin attacking the pancreas before being released into the intestine. Most acute attacks last a couple of days and can cause serious complications. Some people become dehydrated and develop low blood pressure. In severe cases, bleeding into the pancreas can lead to shock and can be life-threatening. The lungs, heart, and kidneys may fail. CAUSES  Pancreatitis can happen to anyone. In some cases, the cause is unknown. Most cases are caused by:  Alcohol abuse.  Gallstones. Other less common causes are:  Certain medicines.  Exposure to certain chemicals.  Infection.  Damage caused by an accident (trauma).  Abdominal surgery. SYMPTOMS   Pain in the upper abdomen that may radiate to the back.  Tenderness and swelling of the abdomen.  Nausea and vomiting. DIAGNOSIS  Your caregiver will perform a physical exam. Blood and stool tests may be done to confirm the diagnosis. Imaging tests may also be done, such as X-rays, CT scans, or an ultrasound of the abdomen. TREATMENT  Treatment usually requires a stay in the hospital. Treatment may include:  Pain medicine.  Fluid replacement through an intravenous line (IV).  Placing a tube in the stomach to remove stomach contents and control vomiting.  Not eating for 3 or 4 days. This gives your pancreas a rest, because enzymes are not being produced that can cause further damage.  Antibiotic medicines if your condition is caused by an infection.  Surgery of the pancreas or gallbladder. HOME CARE INSTRUCTIONS   Follow the diet advised by your  caregiver. This may involve avoiding alcohol and decreasing the amount of fat in your diet.  Eat smaller, more frequent meals. This reduces the amount of digestive juices the pancreas produces.  Drink enough fluids to keep your urine clear or pale yellow.  Only take over-the-counter or prescription medicines as directed by your caregiver.  Avoid drinking alcohol if it caused your condition.  Do not smoke.  Get plenty of rest.  Check your blood sugar at home as directed by your caregiver.  Keep all follow-up appointments as directed by your caregiver. SEEK MEDICAL CARE IF:   You do not recover as quickly as expected.  You develop new or worsening symptoms.  You have persistent pain, weakness, or nausea.  You recover and then have another episode of pain. SEEK IMMEDIATE MEDICAL CARE IF:   You are unable to eat or keep fluids down.  Your pain becomes severe.  You have a fever or persistent symptoms for more than 2 to 3 days.  You have a fever and your symptoms suddenly get worse.  Your skin or the white part of your eyes turn yellow (jaundice).  You develop vomiting.  You feel dizzy, or you faint.  Your blood sugar is high (over 300 mg/dL). MAKE SURE YOU:   Understand these instructions.  Will watch your condition.  Will get help right away if you are not doing well or get worse. Document Released: 05/07/2005 Document Revised: 11/06/2011 Document Reviewed: 08/16/2011 [facility] Patient Information 2013 [place], [place].   Narcotic and benzodiazepine use may cause drowsiness, slowed breathing or dependence.  Please use with caution and do not drive, operate machinery  or watch young children alone while taking them.  Taking combinations of these medications or drinking alcohol will potentiate these effects.     RESOURCE GUIDE  Chronic Pain Problems: Contact [facility]  [phone] Patients need to be referred by their primary care  doctor.  Insufficient Money for Medicine: Contact United Way:  call 211.   No Primary Care Doctor: - Call Health Connect  [phone] - can help you locate a primary care doctor that  accepts your insurance, provides certain services, etc. - Physician Referral Service- [phone]  Agencies that provide inexpensive medical care: - [facility]  [phone] - [facility]  [phone] - [facility]  [phone] - Women's Clinic  [phone] - [facility]  [phone] [facility]  [phone]  Medicaid-accepting [facility] Providers: - [facility]- [address]  [phone], Mon-Fri 9am-7pm, Sat 9am-1pm - [facility]- [address] [place], [address] [place]  [phone] - [facility]- [address] [place]  [phone] [facility]- [address]  [phone] - Kennieth Leech- [address] [place], [address], [phone]  Only accepts [place] patients after they have their name  applied to their card  Self Pay (no insurance) in [place]: - Sickle Cell Patients: Dr Camellia Hutchinson, [facility]  [address] [place], [phone] - [facility]- [address] [employer]  [phone]       [place] [place]- [address]       -     [facility]- see information above (Speak to [employer] if you do not have insurance)       -  [facility] [place]- [address],  [phone]       -  [facility]- [address], [phone]       -  Dr Catalina-  [address], [place], [phone]       -  [facility] - [address], [phone]       -  [facility]- [address] [phone], also [address], [phone]       -    [facility]- [address] [place], [phone], 1st & 3rd Saturday        every  month, 10am-1pm  [facility] [address] [place], [place] [address] [phone]  [facility] [address] [place], [place] [address] [phone]  1) Find a Doctor and Pay Out of Pocket Although you won't have to find out who is covered by your insurance plan, it is a good idea to ask around and get recommendations. You will then need to call the office and see if the doctor you have chosen will accept you as a new patient and what types of options they offer for patients who are self-pay. Some doctors offer discounts or will set up payment plans for their patients who do not have insurance, but you will need to ask so you aren't surprised when you get to your appointment.  2) Contact Your Local Health Department Not all health departments have doctors that can see patients for sick visits, but many do, so it is worth a call to see  if yours does. If you don't know where your local health department is, you can check in your phone book. The CDC also has a tool to help you locate your state's health department, and many state websites also have listings of all of their local health departments.  3) Find a Walk-in Clinic If your illness is not likely to be very severe or complicated, you may want to try a walk in clinic. These are popping up all over the country in pharmacies, drugstores, and shopping centers. They're usually staffed by nurse practitioners or physician assistants that have been trained to treat common illnesses and complaints. They're usually fairly quick and inexpensive. However, if you have serious medical issues or chronic medical problems, these are probably not your best option  STD Testing - [facility] Department of [facility] [place], STD Clinic, [address], phone [phone] or [phone].  Monday - Friday, call for an appointment. [facility] Department of [employer], STD Clinic,  [address], [place], phone [phone] or [phone].  Monday - Friday, call for an appointment.  Abuse/Neglect: [facility] Child Abuse Hotline 479 860 4857 [facility] Child Abuse Hotline 2362639757 (After Hours)  Emergency Shelter:  [facility] [phone]  Maternity Homes: - Room at the [place] [phone] - [facility] [phone]  MRSA Hotline #:   (407)214-3312  Dental Assistance If unable to pay or uninsured, contact:  [facility]. to become qualified for the adult dental clinic.  Patients with Medicaid: [facility] [phone] [place], [phone] [address], [phone]  If unable to pay, or uninsured, contact [facility] [phone] in [place], [phone] in [facility]) to become qualified for the adult dental clinic  [facility] [address] [place], [place] [address] [phone] [email or web address]  Other Proofreader Services: - [facility]- [address] [place], [place], [place], [address], [phone], Ext. [phone], 2nd and 4th Thursday of the month at 6:30am.  10 clients each day by appointment, can sometimes see walk-in patients if someone does not show for an appointment. [facility]- [address] [place], [place], [address], [phone] - [facility] [address], [place], [place], [address], [phone] - [facility]- [phone] [facility] Health Department- [phone] [facility] Health [phone]       Behavioral Health Resources in the [facility]  Intensive Outpatient Programs: [facility]      [address] [place], [place] [phone] Both a day and evening program       [facility] Outpatient     [address]        [place], [place] [address] [phone]         ADS:  [facility] [address] [place] [phone]  [facility] Mental Health ACCESS LINE: [phone] or [phone] [address] [place], [place] [address] [email or web address]   Substance Abuse Resources: - [facility] Drug [facility]  [phone] - [facility] [phone] - [facility] [place] [phone] [place] [phone] - Residential & Outpatient Substance Abuse Program  [phone]  Psychological Services: [facility]  [phone] [facility]  [phone] - [facility], [phone] [place]. [address], [place], ACCESS LINE: [phone] or [phone], [email or web address]  Mobile Crisis Teams:  Therapeutic Alternatives         Mobile Crisis Care Unit [phone]             [facility] [address] [place] [phone]                                         Interventionist [address] [place] [address] [place] [phone]  Self-Help/Support Groups: Mental Health Assoc. of [employer] of support groups 7015134410 (call for more info)   Narcotics Anonymous (NA) Caring Services [address] [place] - 2 meetings at this location  Residential Treatment Programs:  [facility]      [address]        [place]       [phone]         [facility] [address], [place] [address] [place], [place]  [address] [phone]  [facility]  [address] [place], [place] [address] [phone] Admissions: 8am-3pm M-F  [facility]     [address]        [place], [place] [address]       [phone]         [facility] [address] Christianna COYER [place], [place] [phone]  [facility] [address] [place],  [place] [phone]  [facility] - Intensive Outpatient      [address]     [place], [place]       [phone]         [facility] (Addiction Recovery [facility]     [address] [place], [place] [phone] or [phone]  Residential Treatment Services (RTS), Medicaid [address] [place], [place] [phone]  Fellowship [address]                                               [address] [place] [phone]  [facility] Resources: [phone]               General Therapy                                                Mliss Rival, PhD        [address] [place], [place] [address]         [phone]   Insurance  [facility] Behavioral   [address] [place], [place] [address] [phone]  [facility] [address] [place], [place] [address] [phone] Insurance/Medicaid/sponsorship through [facility]  Faith and Families                                              [phone] Suite [phone]  [place], [place] [address]    Therapy/tele-psych/case         [phone]          [facility] [address], [place]  [address]  Adolescent/group home/case management [phone]                                           Recardo Rival PhD       General therapy       Insurance   [phone]         Dr. Curry, [place], M-F [phone]  [facility]  United [employer] [facility] Dept. [address]                 [address]         [address] [place] [address]  Tinnie Keenan Keenan Phone:  [phone]                                  Phone:  [phone]                   Phone:  [phone]  [facility] Mental Health, [phone] - [facility] - [facility]-  [phone]       -     [facility] in [place], [address]             [phone], Insurance  [place] Child Abuse Hotline 480-143-8643 or [phone] (After Hours)

## 2014-02-18 ENCOUNTER — Emergency Department (HOSPITAL_COMMUNITY)
Admission: EM | Admit: 2014-02-18 | Discharge: 2014-02-18 | Disposition: A | Discharge status: Home / Self Care | Payer: BC Managed Care – PPO | Attending: Emergency Medicine | Admitting: Emergency Medicine

## 2014-02-18 ENCOUNTER — Encounter (HOSPITAL_COMMUNITY): Payer: Self-pay | Admitting: Emergency Medicine

## 2014-02-18 DIAGNOSIS — Z8719 Personal history of other diseases of the digestive system: Secondary | ICD-10-CM | POA: Diagnosis not present

## 2014-02-18 DIAGNOSIS — Z79899 Other long term (current) drug therapy: Secondary | ICD-10-CM | POA: Insufficient documentation

## 2014-02-18 DIAGNOSIS — Z720 Tobacco use: Secondary | ICD-10-CM | POA: Insufficient documentation

## 2014-02-18 DIAGNOSIS — Z880 Allergy status to penicillin: Secondary | ICD-10-CM | POA: Diagnosis not present

## 2014-02-18 DIAGNOSIS — H1013 Acute atopic conjunctivitis, bilateral: Secondary | ICD-10-CM | POA: Diagnosis not present

## 2014-02-18 DIAGNOSIS — H5713 Ocular pain, bilateral: Secondary | ICD-10-CM | POA: Diagnosis present

## 2014-02-18 MED ORDER — LORATADINE 10 MG PO TABS
10.0000 mg | ORAL_TABLET | Freq: Once | ORAL | Status: AC
  Administered 2014-02-18: 10 mg via ORAL
  Filled 2014-02-18: qty 1

## 2014-02-18 MED ORDER — LORATADINE 10 MG PO TABS: 10.0000 mg | ORAL_TABLET | Freq: Every day | ORAL | Status: DC

## 2014-02-18 MED ORDER — NAPHAZOLINE-PHENIRAMINE 0.025-0.3 % OP SOLN: 1.0000 [drp] | Freq: Four times a day (QID) | OPHTHALMIC | Status: DC | PRN

## 2014-02-18 NOTE — Discharge Instructions (Signed)
Please follow up with your primary care physician in 1-2 days. If you do not have one please call the [facility] number listed above. Please follow up with Dr. Baird Cancer, the ophthalmologist, to schedule a follow up appointment. Please take all medications as prescribed. Please read all discharge instructions and return precautions.   Allergic Conjunctivitis The conjunctiva is a thin membrane that covers the visible white part of the eyeball and the underside of the eyelids. This membrane protects and lubricates the eye. The membrane has small blood vessels running through it that can normally be seen. When the conjunctiva becomes inflamed, the condition is called conjunctivitis. In response to the inflammation, the conjunctival blood vessels become swollen. The swelling results in redness in the normally white part of the eye. The blood vessels of this membrane also react when a person has allergies and is then called allergic conjunctivitis. This condition usually lasts for as long as the allergy persists. Allergic conjunctivitis cannot be passed to another person (non-contagious). The likelihood of bacterial infection is great and the cause is not likely due to allergies if the inflamed eye has:  A sticky discharge.  Discharge or sticking together of the lids in the morning.  Scaling or flaking of the eyelids where the eyelashes come out.  Red swollen eyelids. CAUSES   Viruses.  Irritants such as foreign bodies.  Chemicals.  General allergic reactions.  Inflammation or serious diseases in the inside or the outside of the eye or the orbit (the boney cavity in which the eye sits) can cause a "red eye." SYMPTOMS   Eye redness.  Tearing.  Itchy eyes.  Burning feeling in the eyes.  Clear drainage from the eye.  Allergic reaction due to pollens or ragweed sensitivity. Seasonal allergic conjunctivitis is frequent in the spring when pollens are in the air and in the  fall. DIAGNOSIS  This condition, in its many forms, is usually diagnosed based on the history and an ophthalmological exam. It usually involves both eyes. If your eyes react at the same time every year, allergies may be the cause. While most "red eyes" are due to allergy or an infection, the role of an eye (ophthalmological) exam is important. The exam can rule out serious diseases of the eye or orbit. TREATMENT   Non-antibiotic eye drops, ointments, or medications by mouth may be prescribed if the ophthalmologist is sure the conjunctivitis is due to allergies alone.  Over-the-counter drops and ointments for allergic symptoms should be used only after other causes of conjunctivitis have been ruled out, or as your caregiver suggests. Medications by mouth are often prescribed if other allergy-related symptoms are present. If the ophthalmologist is sure that the conjunctivitis is due to allergies alone, treatment is normally limited to drops or ointments to reduce itching and burning. HOME CARE INSTRUCTIONS   Wash hands before and after applying drops or ointments, or touching the inflamed eye(s) or eyelids.  Do not let the eye dropper tip or ointment tube touch the eyelid when putting medicine in your eye.  Stop using your soft contact lenses and throw them away. Use a new pair of lenses when recovery is complete. You should run through sterilizing cycles at least three times before use after complete recovery if the old soft contact lenses are to be used. Hard contact lenses should be stopped. They need to be thoroughly sterilized before use after recovery.  Itching and burning eyes due to allergies is often relieved by using  a cool cloth applied to closed eye(s). SEEK MEDICAL CARE IF:   Your problems do not go away after two or three days of treatment.  Your lids are sticky (especially in the morning when you wake up) or stick together.  Discharge develops. Antibiotics may be needed either as  drops, ointment, or by mouth.  You have extreme light sensitivity.  An oral temperature above 102 F (38.9 C) develops.  Pain in or around the eye or any other visual symptom develops. MAKE SURE YOU:   Understand these instructions.  Will watch your condition.  Will get help right away if you are not doing well or get worse. Document Released: 07/28/2002 Document Revised: 07/30/2011 Document Reviewed: 06/23/2007 [facility] Patient Information 2015 [place], [place]. This information is not intended to replace advice given to you by your health care provider. Make sure you discuss any questions you have with your health care provider.

## 2014-02-18 NOTE — ED Notes (Signed)
Pt reports swelling and itchiness on lateral eyes that began 2 days ago. First was on L eye, then spread to R eye. Denies any visual changes. Denies any oral swelling.

## 2014-02-18 NOTE — ED Provider Notes (Signed)
CSN: [id number]     Arrival date & time 02/18/14  1615 History  This chart was scribed for non-physician practitioner Baron Sane, PA-C, working with Leota Jacobsen, MD by Zola Button, ED Scribe. This patient was seen in room [place] and the patient's care was started at 4:43 PM.    Chief Complaint  Patient presents with  . Eye Problem    swelling around eye      The history is provided by the patient. No language interpreter was used.   HPI Comments: Joseph Hess is a 48 y.o. male who presents to the Emergency Department complaining of constant itching pain in eyes bilaterally with swelling that began 2 days ago with associated bilateral clear watery discharge. He notes more drainage from his right eye than his left. He notes associated rhinorrhea and sneezing. He has not tried any treatments for his symptoms.  Patient denies photophobia, changes in vision, foreign body sensation, oropharyngeal swelling. He does not wear any contact lenses.   Past Medical History  Diagnosis Date  . Pancreatitis    History reviewed. No pertinent past surgical history. Family History  Problem Relation Age of Onset  . Hypertension Mother   . Cancer Mother    History  Substance Use Topics  . Smoking status: Current Every Day Smoker    Types: Cigarettes  . Smokeless tobacco: Not on file  . Alcohol Use: Yes     Comment: pt denies use in 2-3 months (11-29-11)    Review of Systems  HENT: Positive for rhinorrhea.   Eyes: Positive for discharge, redness and itching. Negative for photophobia, pain and visual disturbance.  All other systems reviewed and are negative.     Allergies  Penicillins  Home Medications   Prior to Admission medications   Medication Sig Start Date End Date Taking? Authorizing Provider  HYDROcodone-acetaminophen (NORCO/VICODIN) 5-325 MG per tablet 1-2 tablets po q 6 hours prn moderate to severe pain 09/29/12   Saddie Benders. Ghim, MD  loratadine (CLARITIN) 10 MG  tablet Take 1 tablet (10 mg total) by mouth daily. 02/18/14   Twala Collings L Keylin Ferryman, PA-C  naphazoline-pheniramine (NAPHCON-A) 0.025-0.3 % ophthalmic solution Place 1 drop into both eyes 4 (four) times daily as needed for irritation or allergies. 02/18/14   Bellamy Rubey L Dalessandro Baldyga, PA-C  promethazine (PHENERGAN) 25 MG tablet Take 1 tablet (25 mg total) by mouth every 6 (six) hours as needed for nausea. 09/29/12   Saddie Benders. Ghim, MD   BP 143/94  Pulse 97  Temp(Src) 98.3 F (36.8 C) (Oral)  Resp 16  SpO2 100% Physical Exam  Nursing note and vitals reviewed. Constitutional: He is oriented to person, place, and time. He appears well-developed and well-nourished. No distress.  HENT:  Head: Normocephalic and atraumatic.  Right Ear: Hearing, tympanic membrane, external ear and ear canal normal.  Left Ear: Hearing, tympanic membrane, external ear and ear canal normal.  Nose: Rhinorrhea present.  Mouth/Throat: Uvula is midline, oropharynx is clear and moist and mucous membranes are normal.  Eyes: EOM are normal. Pupils are equal, round, and reactive to light. Right eye exhibits discharge (watery). Right eye exhibits no chemosis and no exudate. No foreign body present in the right eye. Left eye exhibits discharge (watery). Left eye exhibits no chemosis and no exudate. No foreign body present in the left eye. Right conjunctiva is injected (mildly). Right conjunctiva has no hemorrhage. Left conjunctiva is injected (midlly). Left conjunctiva has no hemorrhage. No scleral icterus.  Fundoscopic exam:  The right eye shows no exudate, no hemorrhage and no papilledema. The right eye shows red reflex.       The left eye shows no exudate, no hemorrhage and no papilledema. The left eye shows red reflex.  Visual Acuity    Bilateral Near         Bilateral Distance   20/20      R Near         R Distance   20/20      L Near         L Distance   20/25    Neck: Normal range of motion. Neck supple.   Cardiovascular: Normal rate.   Pulmonary/Chest: Effort normal.  Abdominal: Soft.  Musculoskeletal: Normal range of motion.  Lymphadenopathy:    He has no cervical adenopathy.  Neurological: He is alert and oriented to person, place, and time.  Skin: Skin is warm and dry. He is not diaphoretic.  Psychiatric: He has a normal mood and affect.    ED Course  Procedures (including critical care time) Medications  loratadine (CLARITIN) tablet 10 mg (10 mg Oral Given 02/18/14 1741)    Labs Review Labs Reviewed - No data to display  Imaging Review No results found.   EKG Interpretation None      MDM   Final diagnoses:  Allergic conjunctivitis, bilateral    Filed Vitals:   02/18/14 1622  BP: 143/94  Pulse: 97  Temp: 98.3 F (36.8 C)  Resp: 16   Afebrile, NAD, non-toxic appearing, AAOx4.   Patient presentation consistent with allergic conjunctivitis.  No purulent discharge, corneal abrasions, entrapment, consensual photophobia.  Presentation non-concerning for iritis, bacterial conjunctivitis, corneal abrasions, or HSV.  No antibiotics are indicated and patient will be prescribed naphazoline for itching, along with claritin.  Personal hygiene and frequent handwashing discussed.  Patient advised to followup with ophthalmologist if symptoms persist or worsen in any way including vision change or purulent discharge.  Patient verbalizes understanding and is agreeable with discharge. Patient is stable at time of discharge    I personally performed the services described in this documentation, which was scribed in my presence. The recorded information has been reviewed and is accurate.     [place], PA-C 02/18/14 1800

## 2014-02-22 NOTE — ED Provider Notes (Signed)
Medical screening examination/treatment/procedure(s) were performed by non-physician practitioner and as supervising physician I was immediately available for consultation/collaboration.  Leota Jacobsen, MD 02/22/14 1027

## 2014-12-17 ENCOUNTER — Ambulatory Visit (INDEPENDENT_AMBULATORY_CARE_PROVIDER_SITE_OTHER): Payer: BLUE CROSS/BLUE SHIELD | Admitting: Family Medicine

## 2014-12-17 ENCOUNTER — Ambulatory Visit (INDEPENDENT_AMBULATORY_CARE_PROVIDER_SITE_OTHER): Payer: BLUE CROSS/BLUE SHIELD

## 2014-12-17 VITALS — BP 128/82 | HR 69 | Temp 98.2°F | Resp 16 | Ht 70.0 in | Wt 127.0 lb

## 2014-12-17 DIAGNOSIS — Z8719 Personal history of other diseases of the digestive system: Secondary | ICD-10-CM

## 2014-12-17 DIAGNOSIS — R053 Chronic cough: Secondary | ICD-10-CM

## 2014-12-17 DIAGNOSIS — K858 Other acute pancreatitis without necrosis or infection: Secondary | ICD-10-CM

## 2014-12-17 DIAGNOSIS — R634 Abnormal weight loss: Secondary | ICD-10-CM | POA: Diagnosis not present

## 2014-12-17 DIAGNOSIS — R1012 Left upper quadrant pain: Secondary | ICD-10-CM | POA: Diagnosis not present

## 2014-12-17 DIAGNOSIS — R109 Unspecified abdominal pain: Secondary | ICD-10-CM | POA: Diagnosis not present

## 2014-12-17 DIAGNOSIS — R05 Cough: Secondary | ICD-10-CM

## 2014-12-17 DIAGNOSIS — K859 Acute pancreatitis without necrosis or infection, unspecified: Secondary | ICD-10-CM | POA: Insufficient documentation

## 2014-12-17 DIAGNOSIS — R10A2 Flank pain, left side: Secondary | ICD-10-CM

## 2014-12-17 LAB — COMPREHENSIVE METABOLIC PANEL
ALT: 13 U/L (ref 9–46)
AST: 13 U/L (ref 10–40)
Albumin: 4.1 g/dL (ref 3.6–5.1)
Alkaline Phosphatase: 63 U/L (ref 40–115)
BILIRUBIN TOTAL: 0.4 mg/dL (ref 0.2–1.2)
BUN: 7 mg/dL (ref 7–25)
CO2: 28 mmol/L (ref 20–31)
CREATININE: 1.02 mg/dL (ref 0.60–1.35)
Calcium: 9.3 mg/dL (ref 8.6–10.3)
Chloride: 104 mmol/L (ref 98–110)
Glucose, Bld: 91 mg/dL (ref 65–99)
POTASSIUM: 3.4 mmol/L — AB (ref 3.5–5.3)
SODIUM: 139 mmol/L (ref 135–146)
Total Protein: 6.7 g/dL (ref 6.1–8.1)

## 2014-12-17 LAB — POCT CBC
GRANULOCYTE PERCENT: 56.1 % (ref 37–80)
HEMATOCRIT: 43 % — AB (ref 43.5–53.7)
HEMOGLOBIN: 14.4 g/dL (ref 14.1–18.1)
LYMPH, POC: 2.2 (ref 0.6–3.4)
MCH: 27.3 pg (ref 27–31.2)
MCHC: 33.4 g/dL (ref 31.8–35.4)
MCV: 81.8 fL (ref 80–97)
MID (cbc): 0.3 (ref 0–0.9)
MPV: 5.8 fL (ref 0–99.8)
POC GRANULOCYTE: 3.2 (ref 2–6.9)
POC LYMPH %: 38.5 % (ref 10–50)
POC MID %: 5.4 % (ref 0–12)
Platelet Count, POC: 269 10*3/uL (ref 142–424)
RBC: 5.26 M/uL (ref 4.69–6.13)
RDW, POC: 14.4 %
WBC: 5.7 10*3/uL (ref 4.6–10.2)

## 2014-12-17 LAB — POCT UA - MICROSCOPIC ONLY
CASTS, UR, LPF, POC: NEGATIVE
CRYSTALS, UR, HPF, POC: NEGATIVE
EPITHELIAL CELLS, URINE PER MICROSCOPY: NEGATIVE
MUCUS UA: NEGATIVE
RBC, urine, microscopic: NEGATIVE
Yeast, UA: NEGATIVE

## 2014-12-17 LAB — POCT URINALYSIS DIPSTICK
BILIRUBIN UA: NEGATIVE
Blood, UA: NEGATIVE
GLUCOSE UA: NEGATIVE
KETONES UA: NEGATIVE
LEUKOCYTES UA: NEGATIVE
NITRITE UA: NEGATIVE
Protein, UA: NEGATIVE
SPEC GRAV UA: 1.015
UROBILINOGEN UA: 1
pH, UA: 6

## 2014-12-17 LAB — LIPASE: Lipase: 155 U/L — ABNORMAL HIGH (ref 0–75)

## 2014-12-17 LAB — GLUCOSE, POCT (MANUAL RESULT ENTRY): POC GLUCOSE: 100 mg/dL — AB (ref 70–99)

## 2014-12-17 LAB — HIV ANTIBODY (ROUTINE TESTING W REFLEX): HIV 1&2 Ab, 4th Generation: NONREACTIVE

## 2014-12-17 LAB — AMYLASE: AMYLASE: 120 U/L — AB (ref 0–105)

## 2014-12-17 NOTE — Patient Instructions (Signed)
I will give you a call in a little while with the rest of your labs- you may have pancreatitis Rest and stick to clear fluids.   If you are not doing ok at home or if you labs look bad we will get you to the hospital

## 2014-12-17 NOTE — Progress Notes (Signed)
[facility] [address], [place] [address] [phone]  Date:  12/17/2014   Name:  [name]   DOB:  [birth year]   MRN:  [id number]  PCP:  No PCP Per Patient    Chief Complaint: Abdominal Pain; Back Pain; and Chest congestion   History of Present Illness:  Joseph Hess is a 49 y.o. very pleasant male patient who presents with the following:  Here today as a new patient with complaint of abdominal pain for 3 days, and then back pain that started yesterday He notes the belly pain more in the left side of his belly, but it can also "move around." He did eat a meal yesterday, but it seems that it made him feel worse No vomiting but he has been nauseated No diarrhea The back pain is more in the left flank.   He has not noted any fever.   Chart review shows history of alcoholic pancreatitis.   This reminds him of when he had pancreatitis in the past   He is a smoker  NCCSR shows no entries for this pt.  Last norco rx in system was rx in 2014 He states he has not been drinking at all recently Never had a kidney stone No history of abdominal operations  Admits that he has lost weight- I am not able to locate any old weights in the chart, but he states that his normal weight is about 140 lbs    Wt Readings from Last 3 Encounters:  12/17/14 127 lb (57.607 kg)   There are no active problems to display for this patient.   Past Medical History  Diagnosis Date  . Pancreatitis     History reviewed. No pertinent past surgical history.  History  Substance Use Topics  . Smoking status: Current Every Day Smoker    Types: Cigarettes  . Smokeless tobacco: Not on file  . Alcohol Use: Yes     Comment: pt denies use in 2-3 months (11-29-11)    Family History  Problem Relation Age of Onset  . Hypertension Mother   . Cancer Mother     Allergies  Allergen Reactions  . Penicillins     Reaction unknown to pt    Medication list has been  reviewed and updated.  Current Outpatient Prescriptions on File Prior to Visit  Medication Sig Dispense Refill  . HYDROcodone-acetaminophen (NORCO/VICODIN) 5-325 MG per tablet 1-2 tablets po q 6 hours prn moderate to severe pain (Patient not taking: Reported on 12/17/2014) 20 tablet 0  . loratadine (CLARITIN) 10 MG tablet Take 1 tablet (10 mg total) by mouth daily. (Patient not taking: Reported on 12/17/2014) 30 tablet 0  . naphazoline-pheniramine (NAPHCON-A) 0.025-0.3 % ophthalmic solution Place 1 drop into both eyes 4 (four) times daily as needed for irritation or allergies. (Patient not taking: Reported on 12/17/2014) 15 mL 0   No current facility-administered medications on file prior to visit.    Review of Systems:  As per HPI- otherwise negative.   Physical Examination: Filed Vitals:   12/17/14 1047  BP: 128/82  Pulse: 69  Temp: 98.2 F (36.8 C)  Resp: 16   Filed Vitals:   12/17/14 1047  Height: 5\' 10"  (1.778 m)  Weight: 127 lb (57.607 kg)   Body mass index is 18.22 kg/(m^2). Ideal Body Weight: Weight in (lb) to have BMI = 25: 173.9  GEN: WDWN, NAD, Non-toxic, A & O x 3, looks thin HEENT: Atraumatic, Normocephalic.  Neck supple. No masses, No LAD. Ears and Nose: No external deformity. CV: RRR, No M/G/R. No JVD. No thrill. No extra heart sounds. PULM: CTA B, no wheezes, crackles, rhonchi. No retractions. No resp. distress. No accessory muscle use. ABD: S,  ND, +BS. No rebound. No HSM.  He has left upper abd and left flank tenderness to palpation EXTR: No c/c/e NEURO Normal gait.  PSYCH: Normally interactive. Conversant. Not depressed or anxious appearing.  Calm demeanor.   [facility] reading (PRIMARY) by  Dr. Lorelei Pont. CXR:  Hyperinflation c/w COPD, OW negative    CLINICAL DATA: Chest pain  EXAM: CHEST 2 VIEW  COMPARISON: Chest x-ray dated 07/22/2012.  FINDINGS: Cardiomediastinal silhouette remains normal in size and configuration. Lungs are hyperexpanded  suggesting COPD. Lungs are clear. No evidence of pneumonia. No pleural effusions seen. No pneumothorax. No osseous abnormality.  IMPRESSION: Lungs are hyperexpanded suggesting COPD.  Otherwise unremarkable chest x-ray. Lungs are clear. Heart size is normal.     Results for orders placed or performed in visit on 12/17/14  HIV antibody  Result Value Ref Range   HIV 1&2 Ab, 4th Generation NONREACTIVE NONREACTIVE  Amylase  Result Value Ref Range   Amylase 120 (H) 0 - 105 U/L  Lipase  Result Value Ref Range   Lipase 155 (H) 0 - 75 U/L  Comprehensive metabolic panel  Result Value Ref Range   Sodium 139 135 - 146 mmol/L   Potassium 3.4 (L) 3.5 - 5.3 mmol/L   Chloride 104 98 - 110 mmol/L   CO2 28 20 - 31 mmol/L   Glucose, Bld 91 65 - 99 mg/dL   BUN 7 7 - 25 mg/dL   Creat 1.02 0.60 - 1.35 mg/dL   Total Bilirubin 0.4 0.2 - 1.2 mg/dL   Alkaline Phosphatase 63 40 - 115 U/L   AST 13 10 - 40 U/L   ALT 13 9 - 46 U/L   Total Protein 6.7 6.1 - 8.1 g/dL   Albumin 4.1 3.6 - 5.1 g/dL   Calcium 9.3 8.6 - 10.3 mg/dL  POCT CBC  Result Value Ref Range   WBC 5.7 4.6 - 10.2 K/uL   Lymph, poc 2.2 0.6 - 3.4   POC LYMPH PERCENT 38.5 10 - 50 %L   MID (cbc) 0.3 0 - 0.9   POC MID % 5.4 0 - 12 %M   POC Granulocyte 3.2 2 - 6.9   Granulocyte percent 56.1 37 - 80 %G   RBC 5.26 4.69 - 6.13 M/uL   Hemoglobin 14.4 14.1 - 18.1 g/dL   HCT, POC 43.0 (A) 43.5 - 53.7 %   MCV 81.8 80 - 97 fL   MCH, POC 27.3 27 - 31.2 pg   MCHC 33.4 31.8 - 35.4 g/dL   RDW, POC 14.4 %   Platelet Count, POC 269 142 - 424 K/uL   MPV 5.8 0 - 99.8 fL  POCT glucose (manual entry)  Result Value Ref Range   POC Glucose 100 (A) 70 - 99 mg/dl  POCT UA - Microscopic Only  Result Value Ref Range   WBC, Ur, HPF, POC 0-3    RBC, urine, microscopic neg    Bacteria, U Microscopic trace    Mucus, UA neg    Epithelial cells, urine per micros neg    Crystals, Ur, HPF, POC neg    Casts, Ur, LPF, POC neg    Yeast, UA neg   POCT  urinalysis dipstick  Result Value Ref Range  Color, UA yellow    Clarity, UA clear    Glucose, UA neg    Bilirubin, UA neg    Ketones, UA neg    Spec Grav, UA 1.015    Blood, UA neg    pH, UA 6.0    Protein, UA neg    Urobilinogen, UA 1.0    Nitrite, UA neg    Leukocytes, UA Negative Negative     Assessment and Plan: Abdominal pain, left upper quadrant - Plan: POCT CBC, POCT glucose (manual entry), Amylase, Lipase, Comprehensive metabolic panel, CANCELED: Comprehensive metabolic panel, CANCELED: Amylase, CANCELED: Lipase  Chronic cough - Plan: DG Chest 2 View, Amylase, Lipase, Comprehensive metabolic panel  Left flank pain - Plan: POCT UA - Microscopic Only, POCT urinalysis dipstick, Amylase, Lipase, Comprehensive metabolic panel  History of pancreatitis - Plan: Amylase, Lipase, Comprehensive metabolic panel  Loss of weight - Plan: HIV antibody, Amylase, Lipase, Comprehensive metabolic panel   Here with likley pancreatitis.  Offered to have him seen at the ER so he can get labs right away and be admitted if necessary.  He declines to do this, but would like to see how his labs look to get an idea of how severe his disease is.  Ordered stat labs as above, asked him to stick to clear fluids  Called with labs in the evening- his home number is actually his mom's number and he does not live there.  Called his cell- did not get him but LMOM.  It appears that his disease is fairly mild. Stick to clear fluids but if not getting better please come in for care with [place] or the ER. Tried to reach him a few times but no success,  Will try again tomorrow  Signed Lamar Blinks, MD

## 2014-12-19 ENCOUNTER — Ambulatory Visit (INDEPENDENT_AMBULATORY_CARE_PROVIDER_SITE_OTHER): Payer: BLUE CROSS/BLUE SHIELD | Admitting: Family Medicine

## 2014-12-19 VITALS — BP 98/70 | HR 65 | Temp 98.2°F | Resp 14 | Ht 70.5 in | Wt 130.4 lb

## 2014-12-19 DIAGNOSIS — K861 Other chronic pancreatitis: Secondary | ICD-10-CM

## 2014-12-19 DIAGNOSIS — R109 Unspecified abdominal pain: Secondary | ICD-10-CM

## 2014-12-19 DIAGNOSIS — E876 Hypokalemia: Secondary | ICD-10-CM

## 2014-12-19 LAB — COMPLETE METABOLIC PANEL WITHOUT GFR
Albumin: 4.4 g/dL (ref 3.6–5.1)
BUN: 9 mg/dL (ref 7–25)
Calcium: 9.5 mg/dL (ref 8.6–10.3)
Chloride: 101 mmol/L (ref 98–110)
GFR, Est African American: >89 mL/min (ref >=60)
GFR, Est Non African American: 89 mL/min (ref >=60)
Glucose, Bld: 83 mg/dL (ref 65–99)
Potassium: 4.2 mmol/L (ref 3.5–5.3)
Sodium: 140 mmol/L (ref 135–146)

## 2014-12-19 LAB — POCT UA - MICROSCOPIC ONLY
Bacteria, U Microscopic: NEGATIVE
Casts, Ur, LPF, POC: NEGATIVE
Crystals, Ur, HPF, POC: NEGATIVE
Mucus, UA: NEGATIVE
RBC, urine, microscopic: NEGATIVE
WBC, Ur, HPF, POC: NEGATIVE
Yeast, UA: NEGATIVE

## 2014-12-19 LAB — POCT CBC
Granulocyte percent: 52.1 %G (ref 37–80)
HCT, POC: 44 % (ref 43.5–53.7)
Hemoglobin: 14.6 g/dL (ref 14.1–18.1)
Lymph, poc: 2.3 (ref 0.6–3.4)
MCH, POC: 27.2 pg (ref 27–31.2)
MCHC: 33.1 g/dL (ref 31.8–35.4)
MCV: 82.3 fL (ref 80–97)
MID (cbc): 0.3 (ref 0–0.9)
MPV: 6.6 fL (ref 0–99.8)
POC Granulocyte: 2.8 (ref 2–6.9)
POC LYMPH PERCENT: 41.8 % (ref 10–50)
POC MID %: 6.1 % (ref 0–12)
Platelet Count, POC: 236 10*3/uL (ref 142–424)
RBC: 5.35 M/uL (ref 4.69–6.13)
RDW, POC: 14.5 %
WBC: 5.4 10*3/uL (ref 4.6–10.2)

## 2014-12-19 LAB — POCT URINALYSIS DIPSTICK
Bilirubin, UA: NEGATIVE
Blood, UA: NEGATIVE
Glucose, UA: NEGATIVE
Ketones, UA: NEGATIVE
Leukocytes, UA: NEGATIVE
Nitrite, UA: NEGATIVE
Protein, UA: NEGATIVE
Spec Grav, UA: 1.02
Urobilinogen, UA: 0.2
pH, UA: 6

## 2014-12-19 LAB — COMPLETE METABOLIC PANEL WITH GFR
ALT: 12 U/L (ref 9–46)
AST: 13 U/L (ref 10–40)
Alkaline Phosphatase: 66 U/L (ref 40–115)
CO2: 28 mmol/L (ref 20–31)
Creat: 1 mg/dL (ref 0.60–1.35)
Total Bilirubin: 0.4 mg/dL (ref 0.2–1.2)
Total Protein: 6.8 g/dL (ref 6.1–8.1)

## 2014-12-19 LAB — LIPASE: Lipase: 55 U/L (ref 0–75)

## 2014-12-19 LAB — AMYLASE: Amylase: 80 U/L (ref 0–105)

## 2014-12-19 NOTE — Progress Notes (Signed)
Chief Complaint:  Chief Complaint  Patient presents with  . Follow-up    Abdominal Pain    HPI: Joseph Hess is a 49 y.o. male who reports to [facility] today complaining of recheck for abd pain and elevated pancreatic enzymes, has a hx of pancreatitis He states he has not drank alcohol, but has prior hx of alcohol use, last ED visit for elevated pancreatic enzymes in 2014 . He is tolerating a liquid diet well.  He feels better today, still has some back pain, 4/10 pain, some sharp pain in Left flank  but immensely improved. Per his visit with Dr Lorelei Pont his weight has changed. Ed records scanned from 2012  shows he was 143 lbs in 2009  Wt Readings from Last 3 Encounters:  12/19/14 130 lb 6.4 oz (59.149 kg)  12/17/14 127 lb (57.607 kg)   He had an US done in 2012, results  below:  IMPRESSION:  1. No cholelithiasis or choledocholithiasis is identified. No biliary dilatation. 2. Echogenic liver suggests hepatic steatosis. 3. Indistinct pancreas, partially obscured by bowel gas but potentially partially due to pancreatitis.  Original Report Authenticated By: Carron Curie, M.D.  Last OV from Dr Charlean Sanfilippo is a 49 y.o. very pleasant male patient who presents with the following:  Here today as a new patient with complaint of abdominal pain for 3 days, and then back pain that started yesterday He notes the belly pain more in the left side of his belly, but it can also "move around." He did eat a meal yesterday, but it seems that it made him feel worse No vomiting but he has been nauseated No diarrhea The back pain is more in the left flank.  He has not noted any fever.  Chart review shows history of alcoholic pancreatitis. This reminds him of when he had pancreatitis in the past   He is a smoker  NCCSR shows no entries for this pt. Last norco rx in system was rx in 2014 He states he has not been drinking at all recently Never had a  kidney stone No history of abdominal operations  Admits that he has lost weight- I am not able to locate any old weights in the chart, but he states that his normal weight is about 140 lbs   Wt Readings from Last 3 Encounters:  12/17/14 127 lb (57.607 kg)   There are no active problems to display for this patient.        Past Medical History  Diagnosis Date  . Pancreatitis    History reviewed. No pertinent past surgical history. History   Social History  . Marital Status: Married    Spouse Name: N/A  . Number of Children: N/A  . Years of Education: N/A   Social History Main Topics  . Smoking status: Current Every Day Smoker    Types: Cigarettes  . Smokeless tobacco: Not on file  . Alcohol Use: Yes     Comment: pt denies use in 2-3 months (11-29-11)  . Drug Use: Yes    Special: Marijuana     Comment: weekends  . Sexual Activity: Not on file   Other Topics Concern  . None   Social History Narrative   Family History  Problem Relation Age of Onset  . Hypertension Mother   . Cancer Mother    Allergies  Allergen Reactions  . Penicillins     Reaction unknown to pt   Prior  to Admission medications   Medication Sig Start Date End Date Taking? Authorizing Provider  loratadine (CLARITIN) 10 MG tablet Take 1 tablet (10 mg total) by mouth daily. Patient not taking: Reported on 12/17/2014 02/18/14   Baron Sane, PA-C     ROS: The patient denies fevers, chills, night sweats,  chest pain, palpitations, wheezing, dyspnea on exertion, nausea, vomiting,  hematuria, melena, numbness, weakness, or tingling.  All other systems have been reviewed and were otherwise negative with the exception of those mentioned in the HPI and as above.    PHYSICAL EXAM: Filed Vitals:   12/19/14 1002  BP: 98/70  Pulse: 65  Temp: 98.2 F (36.8 C)  Resp: 14   Body mass index is 18.44 kg/(m^2).   General: Alert, no acute distress, thin AA male HEENT:  Normocephalic,  atraumatic, oropharynx patent. EOMI, PERRLA Cardiovascular:  Regular rate and rhythm, no rubs murmurs or gallops.  No Carotid bruits, radial pulse intact. No pedal edema.  Respiratory: Clear to auscultation bilaterally.  No wheezes, rales, or rhonchi.  No cyanosis, no use of accessory musculature Abdominal: No organomegaly, abdomen is soft and non-tender to normal palpation, no CVA tenderness, positive bowel sounds. No masses. Skin: No rashes. Neurologic: Facial musculature symmetric. Psychiatric: Patient acts appropriately throughout our interaction. Lymphatic: No cervical or submandibular lymphadenopathy Musculoskeletal: Gait intact. No edema, tenderness   LABS: Results for orders placed or performed in visit on 12/17/14  HIV antibody  Result Value Ref Range   HIV 1&2 Ab, 4th Generation NONREACTIVE NONREACTIVE  Amylase  Result Value Ref Range   Amylase 120 (H) 0 - 105 U/L  Lipase  Result Value Ref Range   Lipase 155 (H) 0 - 75 U/L  Comprehensive metabolic panel  Result Value Ref Range   Sodium 139 135 - 146 mmol/L   Potassium 3.4 (L) 3.5 - 5.3 mmol/L   Chloride 104 98 - 110 mmol/L   CO2 28 20 - 31 mmol/L   Glucose, Bld 91 65 - 99 mg/dL   BUN 7 7 - 25 mg/dL   Creat 1.02 0.60 - 1.35 mg/dL   Total Bilirubin 0.4 0.2 - 1.2 mg/dL   Alkaline Phosphatase 63 40 - 115 U/L   AST 13 10 - 40 U/L   ALT 13 9 - 46 U/L   Total Protein 6.7 6.1 - 8.1 g/dL   Albumin 4.1 3.6 - 5.1 g/dL   Calcium 9.3 8.6 - 10.3 mg/dL  POCT CBC  Result Value Ref Range   WBC 5.7 4.6 - 10.2 K/uL   Lymph, poc 2.2 0.6 - 3.4   POC LYMPH PERCENT 38.5 10 - 50 %L   MID (cbc) 0.3 0 - 0.9   POC MID % 5.4 0 - 12 %M   POC Granulocyte 3.2 2 - 6.9   Granulocyte percent 56.1 37 - 80 %G   RBC 5.26 4.69 - 6.13 M/uL   Hemoglobin 14.4 14.1 - 18.1 g/dL   HCT, POC 43.0 (A) 43.5 - 53.7 %   MCV 81.8 80 - 97 fL   MCH, POC 27.3 27 - 31.2 pg   MCHC 33.4 31.8 - 35.4 g/dL   RDW, POC 14.4 %   Platelet Count, POC 269 142 - 424  K/uL   MPV 5.8 0 - 99.8 fL  POCT glucose (manual entry)  Result Value Ref Range   POC Glucose 100 (A) 70 - 99 mg/dl  POCT UA - Microscopic Only  Result Value Ref Range   WBC, Ur,  HPF, POC 0-3    RBC, urine, microscopic neg    Bacteria, U Microscopic trace    Mucus, UA neg    Epithelial cells, urine per micros neg    Crystals, Ur, HPF, POC neg    Casts, Ur, LPF, POC neg    Yeast, UA neg   POCT urinalysis dipstick  Result Value Ref Range   Color, UA yellow    Clarity, UA clear    Glucose, UA neg    Bilirubin, UA neg    Ketones, UA neg    Spec Grav, UA 1.015    Blood, UA neg    pH, UA 6.0    Protein, UA neg    Urobilinogen, UA 1.0    Nitrite, UA neg    Leukocytes, UA Negative Negative     EKG/XRAY:   Primary read interpreted by Dr. Marin Comment at [facility].   ASSESSMENT/PLAN: Encounter Diagnoses  Name Primary?  . Left flank pain Yes  . Other chronic pancreatitis   . Hypokalemia    Doing better overall, nontender on palpation exam.  Tolerating liquid diet Pain is less, only sporadic Repeat labs pending, consider referral to GI since this appears acute on chronic, no further advance imaging studies or workup available per EPIC  Gross sideeffects, risk and benefits, and alternatives of medications d/w patient. Patient is aware that all medications have potential sideeffects and we are unable to predict every sideeffect or drug-drug interaction that may occur.  Teven Mittman DO  12/19/2014 11:51 AM   12/20/14 patient feeling  Better, advise to advance diet, if worse then fu in office, spoke with him about labs.  He will return to office to see Dr Lorelei Pont before he will be referred to GI, this cna be done at next visit

## 2014-12-20 ENCOUNTER — Encounter: Payer: Self-pay | Admitting: Family Medicine

## 2014-12-22 ENCOUNTER — Ambulatory Visit (INDEPENDENT_AMBULATORY_CARE_PROVIDER_SITE_OTHER): Payer: BLUE CROSS/BLUE SHIELD | Admitting: Family Medicine

## 2014-12-22 VITALS — BP 130/80 | HR 66 | Temp 97.8°F | Resp 16 | Ht 70.5 in | Wt 133.0 lb

## 2014-12-22 DIAGNOSIS — K760 Fatty (change of) liver, not elsewhere classified: Secondary | ICD-10-CM | POA: Diagnosis not present

## 2014-12-22 DIAGNOSIS — K7689 Other specified diseases of liver: Secondary | ICD-10-CM | POA: Diagnosis not present

## 2014-12-22 DIAGNOSIS — K858 Other acute pancreatitis without necrosis or infection: Secondary | ICD-10-CM

## 2014-12-22 DIAGNOSIS — K861 Other chronic pancreatitis: Secondary | ICD-10-CM | POA: Diagnosis not present

## 2014-12-22 DIAGNOSIS — K769 Liver disease, unspecified: Secondary | ICD-10-CM

## 2014-12-22 LAB — POCT CBC
GRANULOCYTE PERCENT: 59.8 % (ref 37–80)
HCT, POC: 43.4 % — AB (ref 43.5–53.7)
HEMOGLOBIN: 13.6 g/dL — AB (ref 14.1–18.1)
LYMPH, POC: 1.7 (ref 0.6–3.4)
MCH, POC: 26.1 pg — AB (ref 27–31.2)
MCHC: 31.3 g/dL — AB (ref 31.8–35.4)
MCV: 83.2 fL (ref 80–97)
MID (CBC): 0.4 (ref 0–0.9)
MPV: 7 fL (ref 0–99.8)
POC GRANULOCYTE: 3.2 (ref 2–6.9)
POC LYMPH PERCENT: 33 %L (ref 10–50)
POC MID %: 7.2 % (ref 0–12)
Platelet Count, POC: 238 10*3/uL (ref 142–424)
RBC: 5.22 M/uL (ref 4.69–6.13)
RDW, POC: 15.1 %
WBC: 5.3 10*3/uL (ref 4.6–10.2)

## 2014-12-22 LAB — POCT URINALYSIS DIPSTICK
Bilirubin, UA: NEGATIVE
GLUCOSE UA: NEGATIVE
KETONES UA: NEGATIVE
Leukocytes, UA: NEGATIVE
NITRITE UA: NEGATIVE
PH UA: 6
Protein, UA: NEGATIVE
RBC UA: NEGATIVE
Spec Grav, UA: 1.015
UROBILINOGEN UA: 0.2

## 2014-12-22 LAB — POCT SEDIMENTATION RATE: POCT SED RATE: 9 mm/h (ref 0–22)

## 2014-12-22 LAB — LIPASE: Lipase: 58 U/L (ref 7–60)

## 2014-12-22 MED ORDER — PANCRELIPASE (LIP-PROT-AMYL) 24000-76000 UNITS PO CPEP: 1.0000 | ORAL_CAPSULE | Freq: Every day | ORAL | Status: DC

## 2014-12-22 NOTE — Patient Instructions (Signed)
Low-Fat Diet for Pancreatitis or Gallbladder Conditions °A low-fat diet can be helpful if you have pancreatitis or a gallbladder condition. With these conditions, your pancreas and gallbladder have trouble digesting fats. A healthy eating plan with less fat will help rest your pancreas and gallbladder and reduce your symptoms. °WHAT DO I NEED TO KNOW ABOUT THIS DIET? °· Eat a low-fat diet. °¨ Reduce your fat intake to less than 20-30% of your total daily calories. This is less than 50-60 g of fat per day. °¨ Remember that you need some fat in your diet. Ask your dietician what your daily goal should be. °¨ Choose nonfat and low-fat healthy foods. Look for the words "nonfat," "low fat," or "fat free." °¨ As a guide, look on the label and choose foods with less than 3 g of fat per serving. Eat only one serving. °· Avoid alcohol. °· Do not smoke. If you need help quitting, talk with your health care provider. °· Eat small frequent meals instead of three large heavy meals. °WHAT FOODS CAN I EAT? °Grains °Include healthy grains and starches such as potatoes, wheat bread, fiber-rich cereal, and brown rice. Choose whole grain options whenever possible. In adults, whole grains should account for 45-65% of your daily calories.  °Fruits and Vegetables °Eat plenty of fruits and vegetables. Fresh fruits and vegetables add fiber to your diet. °Meats and Other Protein Sources °Eat lean meat such as chicken and pork. Trim any fat off of meat before cooking it. Eggs, fish, and beans are other sources of protein. In adults, these foods should account for 10-35% of your daily calories. °Dairy °Choose low-fat milk and dairy options. Dairy includes fat and protein, as well as calcium.  °Fats and Oils °Limit high-fat foods such as fried foods, sweets, baked goods, sugary drinks.  °Other °Creamy sauces and condiments, such as mayonnaise, can add extra fat. Think about whether or not you need to use them, or use smaller amounts or low fat  options. °WHAT FOODS ARE NOT RECOMMENDED? °· High fat foods, such as: °¨ Baked goods. °¨ Ice cream. °¨ French toast. °¨ Sweet rolls. °¨ Pizza. °¨ Cheese bread. °¨ Foods covered with batter, butter, creamy sauces, or cheese. °¨ Fried foods. °¨ Sugary drinks and desserts. °· Foods that cause gas or bloating °Document Released: 05/12/2013 Document Reviewed: 05/12/2013 °[employer]® Patient Information ©2015 [employer]. This information is not intended to replace advice given to you by your health care provider. Make sure you discuss any questions you have with your health care provider. ° °

## 2014-12-22 NOTE — Progress Notes (Addendum)
Subjective:  This chart was scribed for Delman Cheadle MD, by Tamsen Roers, at [facility].  This patient was seen in room 2 and the patient's care was started at 11:31 AM.    Patient ID: Joseph Hess, male    DOB: 1965-08-20, 49 y.o.   MRN: [id number] Chief Complaint  Patient presents with  . Weight loss   HPI  HPI Comments: Joseph Hess is a 49 y.o. male who presents to the [facility] for a follow up regarding his back pain. He is able to tell when his back pain is coming on due to in flare up from his pancreatitis.  Patient notes that his work requires that he has a note from a physician for him to be able to return to work. Patient states that he was able to eat "a little bit yesterday".  He had mild nausea after eating and last vomited 4 days ago.  He denies any dysuria or abnormal bowel movements.   He has not taken any medication today and has never taken any pancreatatic enzymes.  Patient no longer drinks alcohol but drinks sodas and eats a lot of spicy fried foods often.    -------- Patient was seen 4 days ago for abdominal pain and has a history of alcoholic pancreatitis. He had a flare of over a week ago.  Lipase was 155 and initially 2 days ago had normalized at 55.  Amylase CBC and UA were also normal at his last visit.  There is a concern that he had lost about 15 pounds in the past 5 years unintentionally.  Had negative HIV, normal CMP and glucose. There is a consideration of referring to GI since he might be developing chronic pancreatitis and instructed to go ahead and enhance his diet.  Since his initial evaluation, he has gained 6 pounds.      Past Medical History  Diagnosis Date  . Pancreatitis     Current Outpatient Prescriptions on File Prior to Visit  Medication Sig Dispense Refill  . loratadine (CLARITIN) 10 MG tablet Take 1 tablet (10 mg total) by mouth daily. (Patient not taking: Reported on 12/17/2014) 30 tablet 0    No current facility-administered medications on file prior to visit.    Allergies  Allergen Reactions  . Penicillins     Reaction unknown to pt      Review of Systems  Constitutional: Negative for fever and chills.  Eyes: Negative for pain and redness.  Gastrointestinal: Positive for nausea and abdominal pain. Negative for vomiting, diarrhea, constipation and abdominal distention.  Genitourinary: Negative for dysuria.  Musculoskeletal: Positive for back pain. Negative for neck pain and neck stiffness.       Objective:   Physical Exam  Constitutional: He is oriented to person, place, and time. He appears well-developed and well-nourished. No distress.  HENT:  Head: Normocephalic and atraumatic.  Eyes: Conjunctivae and EOM are normal.  Neck: Neck supple.  Cardiovascular: Normal rate, regular rhythm and normal heart sounds.  Exam reveals no friction rub.   No murmur heard. Pulmonary/Chest: Effort normal and breath sounds normal. No respiratory distress. He has no wheezes. He has no rales.  Abdominal: Soft. There is tenderness in the left upper quadrant. There is no rebound, no guarding, no CVA tenderness and negative Murphy's sign.  Hyperactive bowel sounds but otherwise normal.   Abdomen soft and non distended.   Mildly tender over epigastrium and left upper quadrant but  no rebound of guarding.     Musculoskeletal: Normal range of motion.  Neurological: He is alert and oriented to person, place, and time.  Skin: Skin is warm and dry.  Psychiatric: He has a normal mood and affect. His behavior is normal.  Nursing note and vitals reviewed.         Assessment & Plan:   1. Other acute pancreatitis - suspect chronic due to freq mild flairs - now resolving so ok to RTW and advance diet.  Try pancreatic enzymes with meals.    2. Hepatic steatosis - liver has not been imagined since [place] in 2012 so repeat  3. Hepatic lesion - seen on US obtained - will try to further define  with additional imaging while GI referral is P. I think pt will either need a triphasicmultidector CT or a dynamic contrast-enhanced MRI  4. Chronic pancreatitis, unspecified pancreatitis type     Orders Placed This Encounter  Procedures  . US Abdomen Complete    133 lbs/NO NEEDS/INS/BCBS/RLC/PT W/EPIC ORDER    Standing Status: Future     Number of Occurrences: 1     Standing Expiration Date: 02/21/2016    Order Specific Question:  Reason for Exam (SYMPTOM  OR DIAGNOSIS REQUIRED)    Answer:  concern for chronic pancreatitis    Order Specific Question:  Preferred imaging location?    Answer:  [facility]  . MR Abdomen W Wo Contrast    Standing Status: Future     Number of Occurrences:      Standing Expiration Date: 02/26/2016    Order Specific Question:  Reason for Exam (SYMPTOM  OR DIAGNOSIS REQUIRED)    Answer:  eval 1.5 liver lesion seen on [place] as well as pancreas with concern for chronic pancreatitis    Order Specific Question:  Preferred imaging location?    Answer:  [facility]    Order Specific Question:  Does the patient have a pacemaker or implanted devices?    Answer:  No    Order Specific Question:  What is the patient's sedation requirement?    Answer:  No Sedation  . Lipase  . Ambulatory referral to Gastroenterology    Referral Priority:  Routine    Referral Type:  Consultation    Referral Reason:  Specialty Services Required    Number of Visits Requested:  1  . POCT CBC  . POCT SEDIMENTATION RATE  . POCT urinalysis dipstick    Meds ordered this encounter  Medications  . Pancrelipase, Lip-Prot-Amyl, 24000 UNITS CPEP    Sig: Take 1 capsule (24,000 Units total) by mouth 6 (six) times daily. As needed with each meal or snack    Dispense:  180 capsule    Refill:  1    I personally performed the services described in this documentation, which was scribed in my presence. The recorded information has been reviewed and considered, and addended by me as  needed.  Delman Cheadle, MD MPH  Results for orders placed or performed in visit on 12/22/14  Lipase  Result Value Ref Range   Lipase 58 7 - 60 U/L  POCT CBC  Result Value Ref Range   WBC 5.3 4.6 - 10.2 K/uL   Lymph, poc 1.7 0.6 - 3.4   POC LYMPH PERCENT 33.0 10 - 50 %L   MID (cbc) 0.4 0 - 0.9   POC MID % 7.2 0 - 12 %M   POC Granulocyte 3.2 2 - 6.9  Granulocyte percent 59.8 37 - 80 %G   RBC 5.22 4.69 - 6.13 M/uL   Hemoglobin 13.6 (A) 14.1 - 18.1 g/dL   HCT, POC 43.4 (A) 43.5 - 53.7 %   MCV 83.2 80 - 97 fL   MCH, POC 26.1 (A) 27 - 31.2 pg   MCHC 31.3 (A) 31.8 - 35.4 g/dL   RDW, POC 15.1 %   Platelet Count, POC 238 142 - 424 K/uL   MPV 7.0 0 - 99.8 fL  POCT SEDIMENTATION RATE  Result Value Ref Range   POCT SED RATE 9 0 - 22 mm/hr  POCT urinalysis dipstick  Result Value Ref Range   Color, UA yellow    Clarity, UA clear    Glucose, UA neg    Bilirubin, UA neg    Ketones, UA neg    Spec Grav, UA 1.015    Blood, UA neg    pH, UA 6.0    Protein, UA neg    Urobilinogen, UA 0.2    Nitrite, UA neg    Leukocytes, UA Negative Negative

## 2014-12-26 ENCOUNTER — Encounter: Payer: Self-pay | Admitting: Family Medicine

## 2014-12-27 ENCOUNTER — Ambulatory Visit
Admission: RE | Admit: 2014-12-27 | Discharge: 2014-12-27 | Disposition: A | Discharge status: Home / Self Care | Payer: BLUE CROSS/BLUE SHIELD | Source: Ambulatory Visit | Attending: Family Medicine | Admitting: Family Medicine

## 2014-12-27 DIAGNOSIS — K858 Other acute pancreatitis without necrosis or infection: Secondary | ICD-10-CM

## 2014-12-27 DIAGNOSIS — K760 Fatty (change of) liver, not elsewhere classified: Secondary | ICD-10-CM

## 2014-12-27 DIAGNOSIS — K769 Liver disease, unspecified: Secondary | ICD-10-CM | POA: Insufficient documentation

## 2014-12-30 ENCOUNTER — Ambulatory Visit (INDEPENDENT_AMBULATORY_CARE_PROVIDER_SITE_OTHER): Payer: BLUE CROSS/BLUE SHIELD | Admitting: Family Medicine

## 2014-12-30 ENCOUNTER — Encounter: Payer: Self-pay | Admitting: Gastroenterology

## 2014-12-30 ENCOUNTER — Ambulatory Visit (INDEPENDENT_AMBULATORY_CARE_PROVIDER_SITE_OTHER): Payer: BLUE CROSS/BLUE SHIELD

## 2014-12-30 VITALS — BP 149/98 | HR 66 | Temp 98.5°F | Ht 69.25 in | Wt 134.0 lb

## 2014-12-30 DIAGNOSIS — M545 Low back pain, unspecified: Secondary | ICD-10-CM

## 2014-12-30 DIAGNOSIS — K7689 Other specified diseases of liver: Secondary | ICD-10-CM | POA: Diagnosis not present

## 2014-12-30 DIAGNOSIS — E876 Hypokalemia: Secondary | ICD-10-CM

## 2014-12-30 DIAGNOSIS — R42 Dizziness and giddiness: Secondary | ICD-10-CM | POA: Diagnosis not present

## 2014-12-30 DIAGNOSIS — D649 Anemia, unspecified: Secondary | ICD-10-CM | POA: Diagnosis not present

## 2014-12-30 DIAGNOSIS — M546 Pain in thoracic spine: Secondary | ICD-10-CM

## 2014-12-30 DIAGNOSIS — M549 Dorsalgia, unspecified: Secondary | ICD-10-CM | POA: Diagnosis not present

## 2014-12-30 DIAGNOSIS — K861 Other chronic pancreatitis: Secondary | ICD-10-CM | POA: Diagnosis not present

## 2014-12-30 DIAGNOSIS — K769 Liver disease, unspecified: Secondary | ICD-10-CM

## 2014-12-30 LAB — POCT URINALYSIS DIPSTICK
Bilirubin, UA: NEGATIVE
Blood, UA: NEGATIVE
Glucose, UA: NEGATIVE
LEUKOCYTES UA: NEGATIVE
Nitrite, UA: NEGATIVE
PH UA: 6
Protein, UA: NEGATIVE
Spec Grav, UA: 1.025
Urobilinogen, UA: 0.2

## 2014-12-30 LAB — COMPREHENSIVE METABOLIC PANEL
ALT: 17 U/L (ref 9–46)
AST: 15 U/L (ref 10–40)
Albumin: 3.6 g/dL (ref 3.6–5.1)
Alkaline Phosphatase: 64 U/L (ref 40–115)
BILIRUBIN TOTAL: 0.3 mg/dL (ref 0.2–1.2)
BUN: 7 mg/dL (ref 7–25)
CHLORIDE: 107 mmol/L (ref 98–110)
CO2: 28 mmol/L (ref 20–31)
CREATININE: 1.16 mg/dL (ref 0.60–1.35)
Calcium: 8.5 mg/dL — ABNORMAL LOW (ref 8.6–10.3)
Glucose, Bld: 100 mg/dL — ABNORMAL HIGH (ref 65–99)
Potassium: 4.1 mmol/L (ref 3.5–5.3)
SODIUM: 139 mmol/L (ref 135–146)
TOTAL PROTEIN: 6 g/dL — AB (ref 6.1–8.1)

## 2014-12-30 LAB — POCT CBC
Granulocyte percent: 53.4 %G (ref 37–80)
HCT, POC: 40.6 % — AB (ref 43.5–53.7)
Hemoglobin: 13 g/dL — AB (ref 14.1–18.1)
LYMPH, POC: 2.1 (ref 0.6–3.4)
MCH: 26.7 pg — AB (ref 27–31.2)
MCHC: 31.9 g/dL (ref 31.8–35.4)
MCV: 83.6 fL (ref 80–97)
MID (CBC): 0.1 (ref 0–0.9)
MPV: 7.1 fL (ref 0–99.8)
PLATELET COUNT, POC: 216 10*3/uL (ref 142–424)
POC Granulocyte: 2.5 (ref 2–6.9)
POC LYMPH %: 45.5 % (ref 10–50)
POC MID %: 1.1 %M (ref 0–12)
RBC: 4.86 M/uL (ref 4.69–6.13)
RDW, POC: 16.2 %
WBC: 4.7 10*3/uL (ref 4.6–10.2)

## 2014-12-30 LAB — POCT UA - MICROSCOPIC ONLY
CASTS, UR, LPF, POC: NEGATIVE
CRYSTALS, UR, HPF, POC: NEGATIVE
MUCUS UA: POSITIVE
Yeast, UA: NEGATIVE

## 2014-12-30 LAB — LIPASE: Lipase: 66 U/L — ABNORMAL HIGH (ref 7–60)

## 2014-12-30 LAB — VITAMIN B12: Vitamin B-12: 921 pg/mL — ABNORMAL HIGH (ref 211–911)

## 2014-12-30 LAB — FOLATE: Folate: 9.9 ng/mL

## 2014-12-30 LAB — POCT SEDIMENTATION RATE: POCT SED RATE: 9 mm/hr (ref 0–22)

## 2014-12-30 LAB — FERRITIN: Ferritin: 121 ng/mL (ref 22–322)

## 2014-12-30 MED ORDER — CYCLOBENZAPRINE HCL 10 MG PO TABS: 10.0000 mg | ORAL_TABLET | Freq: Three times a day (TID) | ORAL | Status: DC | PRN

## 2014-12-30 MED ORDER — HYDROCODONE-ACETAMINOPHEN 5-325 MG PO TABS: 1.0000 | ORAL_TABLET | Freq: Four times a day (QID) | ORAL | Status: DC | PRN

## 2014-12-30 NOTE — Patient Instructions (Addendum)
[facility], [place], [place] [address]  Phone: [phone]  Alonza Bogus, [place] 10 AM January 19, 2015 Please arrive 20 minutes early to fill out paperwork.  They will also be mailing you a new patient package.  Please fill out and bring with you.  Back Injury Prevention Back injuries can be extremely painful and difficult to heal. After having one back injury, you are much more likely to experience another later on. It is important to learn how to avoid injuring or re-injuring your back. The following tips can help you to prevent a back injury. PHYSICAL FITNESS  Exercise regularly and try to develop good tone in your abdominal muscles. Your abdominal muscles provide a lot of the support needed by your back.  Do aerobic exercises (walking, jogging, biking, swimming) regularly.  Do exercises that increase balance and strength (tai chi, yoga) regularly. This can decrease your risk of falling and injuring your back.  Stretch before and after exercising.  Maintain a healthy weight. The more you weigh, the more stress is placed on your back. For every pound of weight, 10 times that amount of pressure is placed on the back. DIET  Talk to your caregiver about how much calcium and vitamin D you need per day. These nutrients help to prevent weakening of the bones (osteoporosis). Osteoporosis can cause broken (fractured) bones that lead to back pain.  Include good sources of calcium in your diet, such as dairy products, green, leafy vegetables, and products with calcium added (fortified).  Include good sources of vitamin D in your diet, such as milk and foods that are fortified with vitamin D.  Consider taking a nutritional supplement or a multivitamin if needed.  Stop smoking if you smoke. POSTURE  Sit and stand up straight. Avoid leaning forward when you sit or hunching over when you stand.  Choose chairs with good low back (lumbar) support.  If you work at a desk, sit close  to your work so you do not need to lean over. Keep your chin tucked in. Keep your neck drawn back and elbows bent at a right angle. Your arms should look like the letter "L."  Sit high and close to the steering wheel when you drive. Add a lumbar support to your car seat if needed.  Avoid sitting or standing in one position for too long. Take breaks to get up, stretch, and walk around at least once every hour. Take breaks if you are driving for long periods of time.  Sleep on your side with your knees slightly bent, or sleep on your back with a pillow under your knees. Do not sleep on your stomach. LIFTING, TWISTING, AND REACHING  Avoid heavy lifting, especially repetitive lifting. If you must do heavy lifting:  Stretch before lifting.  Work slowly.  Rest between lifts.  Use carts and dollies to move objects when possible.  Make several small trips instead of carrying 1 heavy load.  Ask for help when you need it.  Ask for help when moving big, awkward objects.  Follow these steps when lifting:  Stand with your feet shoulder-width apart.  Get as close to the object as you can. Do not try to pick up heavy objects that are far from your body.  Use handles or lifting straps if they are available.  Bend at your knees. Squat down, but keep your heels off the floor.  Keep your shoulders pulled back, your chin tucked in, and your back straight.  Lift the object slowly, tightening the muscles in your legs, abdomen, and buttocks. Keep the object as close to the center of your body as possible.  When you put a load down, use these same guidelines in reverse.  Do not:  Lift the object above your waist.  Twist at the waist while lifting or carrying a load. Move your feet if you need to turn, not your waist.  Bend over without bending at your knees.  Avoid reaching over your head, across a table, or for an object on a high surface. OTHER TIPS  Avoid wet floors and keep sidewalks  clear of ice to prevent falls.  Do not sleep on a mattress that is too soft or too hard.  Keep items that are used frequently within easy reach.  Put heavier objects on shelves at waist level and lighter objects on lower or higher shelves.  Find ways to decrease your stress, such as exercise, massage, or relaxation techniques. Stress can build up in your muscles. Tense muscles are more vulnerable to injury.  Seek treatment for depression or anxiety if needed. These conditions can increase your risk of developing back pain. SEEK MEDICAL CARE IF:  You injure your back.  You have questions about diet, exercise, or other ways to prevent back injuries. MAKE SURE YOU:  Understand these instructions.  Will watch your condition.  Will get help right away if you are not doing well or get worse. Document Released: 06/14/2004 Document Revised: 07/30/2011 Document Reviewed: 06/18/2011 [facility] Patient Information 2015 [place], [place]. This information is not intended to replace advice given to you by your health care provider. Make sure you discuss any questions you have with your health care provider.  Back Pain, Adult Low back pain is very common. About 1 in 5 people have back pain.The cause of low back pain is rarely dangerous. The pain often gets better over time.About half of people with a sudden onset of back pain feel better in just 2 weeks. About 8 in 10 people feel better by 6 weeks.  CAUSES Some common causes of back pain include:  Strain of the muscles or ligaments supporting the spine.  Wear and tear (degeneration) of the spinal discs.  Arthritis.  Direct injury to the back. DIAGNOSIS Most of the time, the direct cause of low back pain is not known.However, back pain can be treated effectively even when the exact cause of the pain is unknown.Answering your caregiver's questions about your overall health and symptoms is one of the most accurate ways to make sure the cause of  your pain is not dangerous. If your caregiver needs more information, he or she may order lab work or imaging tests (X-rays or MRIs).However, even if imaging tests show changes in your back, this usually does not require surgery. HOME CARE INSTRUCTIONS For many people, back pain returns.Since low back pain is rarely dangerous, it is often a condition that people can learn to [facility] their own.   Remain active. It is stressful on the back to sit or stand in one place. Do not sit, drive, or stand in one place for more than 30 minutes at a time. Take short walks on level surfaces as soon as pain allows.Try to increase the length of time you walk each day.  Do not stay in bed.Resting more than 1 or 2 days can delay your recovery.  Do not avoid exercise or work.Your body is made to move.It is not dangerous to be active, even  though your back may hurt.Your back will likely heal faster if you return to being active before your pain is gone.  Pay attention to your body when you bend and lift. Many people have less discomfortwhen lifting if they bend their knees, keep the load close to their bodies,and avoid twisting. Often, the most comfortable positions are those that put less stress on your recovering back.  Find a comfortable position to sleep. Use a firm mattress and lie on your side with your knees slightly bent. If you lie on your back, put a pillow under your knees.  Only take over-the-counter or prescription medicines as directed by your caregiver. Over-the-counter medicines to reduce pain and inflammation are often the most helpful.Your caregiver may prescribe muscle relaxant drugs.These medicines help dull your pain so you can more quickly return to your normal activities and healthy exercise.  Put ice on the injured area.  Put ice in a plastic bag.  Place a towel between your skin and the bag.  Leave the ice on for 15-20 minutes, 03-04 times a day for the first 2 to 3 days.  After that, ice and heat may be alternated to reduce pain and spasms.  Ask your caregiver about trying back exercises and gentle massage. This may be of some benefit.  Avoid feeling anxious or stressed.Stress increases muscle tension and can worsen back pain.It is important to recognize when you are anxious or stressed and learn ways to manage it.Exercise is a great option. SEEK MEDICAL CARE IF:  You have pain that is not relieved with rest or medicine.  You have pain that does not improve in 1 week.  You have new symptoms.  You are generally not feeling well. SEEK IMMEDIATE MEDICAL CARE IF:   You have pain that radiates from your back into your legs.  You develop new bowel or bladder control problems.  You have unusual weakness or numbness in your arms or legs.  You develop nausea or vomiting.  You develop abdominal pain.  You feel faint. Document Released: 05/07/2005 Document Revised: 11/06/2011 Document Reviewed: 09/08/2013 [facility] Patient Information 2015 [place], [place]. This information is not intended to replace advice given to you by your health care provider. Make sure you discuss any questions you have with your health care provider. Back Exercises Back exercises help treat and prevent back injuries. The goal is to increase your strength in your belly (abdominal) and back muscles. These exercises can also help with flexibility. Start these exercises when told by your doctor. HOME CARE Back exercises include: Pelvic Tilt.  Lie on your back with your knees bent. Tilt your pelvis until the lower part of your back is against the floor. Hold this position 5 to 10 sec. Repeat this exercise 5 to 10 times. Knee to Chest.  Pull 1 knee up against your chest and hold for 20 to 30 seconds. Repeat this with the other knee. This may be done with the other leg straight or bent, whichever feels better. Then, pull both knees up against your chest. Sit-Ups or Curl-Ups.  Bend your  knees 90 degrees. Start with tilting your pelvis, and do a partial, slow sit-up. Only lift your upper half 30 to 45 degrees off the floor. Take at least 2 to 3 seonds for each sit-up. Do not do sit-ups with your knees out straight. If partial sit-ups are difficult, simply do the above but with only tightening your belly (abdominal) muscles and holding it as told. Hip-Lift.  Lie on  your back with your knees flexed 90 degrees. Push down with your feet and shoulders as you raise your hips 2 inches off the floor. Hold for 10 seconds, repeat 5 to 10 times. Back Arches.  Lie on your stomach. Prop yourself up on bent elbows. Slowly press on your hands, causing an arch in your low back. Repeat 3 to 5 times. Shoulder-Lifts.  Lie face down with arms beside your body. Keep hips and belly pressed to floor as you slowly lift your head and shoulders off the floor. Do not overdo your exercises. Be careful in the beginning. Exercises may cause you some mild back discomfort. If the pain lasts for more than 15 minutes, stop the exercises until you see your doctor. Improvement with exercise for back problems is slow.  Document Released: 06/09/2010 Document Revised: 07/30/2011 Document Reviewed: 03/08/2011 [facility] Patient Information 2015 [place], [place]. This information is not intended to replace advice given to you by your health care provider. Make sure you discuss any questions you have with your health care provider.

## 2014-12-30 NOTE — Progress Notes (Addendum)
Subjective:  This chart was scribed for Delman Cheadle, MD by Leandra Kern, Medical Scribe. This patient was seen in Room 8 and the patient's care was started at 9:07 AM.   Patient ID: Joseph Hess, male    DOB: [birth year], 49 y.o.   MRN: [id number]  Chief Complaint  Patient presents with  . Back Pain    L/flank pain continues.  Pt unable to work due to pain    HPI HPI Comments: Joseph Hess is a 49 y.o. male who presents to [facility] complaining of an ongoing left flank pain.   Pt has a history of alcoholic pancreatitis, has been sober for several years, but has been having abdominal pain, concerning of chronic pancreatitis. Pt was last here few weeks ago, and was put on a strict low fat diet with symptoms resolution, lipase returned to normal, CMP was also normal other than some hyperkalemia that has resolved since. At his last visit, I let him return to work, continue low fat diet, and definitely start pancreatic enzymes. 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 [facility] called pt, but he yet to return call to schedule. It appear pt has developed chronic pancreatic, but more concernly had 21.5 lever lesion that was new. An MRI of liver was ordered to better evaluate.    Today, pt reports that he is having a severe flank pain, onset last week. Pt denies having any abdominal pain associated with his complaint. Pt notes associated symptoms of indigestions, heart burn, and light headedness. Pt reports that he has been eating well, and denies taking any OTC medications. Pt denies neck pain, urinary or bowel complications, numbness in arms or legs, chest pain, palpitaions, recent falls, or previous back problems. Pt reports no trouble getting up the stairs, or riding the car. Pt reports that he has been having a good appetite. He indicates   Pt reports that he has not set an appointment with [facility].   Patient Active Problem  List   Diagnosis Date Noted  . Hepatic lesion 12/27/2014  . Pancreatitis, acute 12/17/2014   Past Medical History  Diagnosis Date  . Pancreatitis    No past surgical history on file. Allergies  Allergen Reactions  . Penicillins     Reaction unknown to pt   Prior to Admission medications   Medication Sig Start Date End Date Taking? Authorizing Provider  Pancrelipase, Lip-Prot-Amyl, 24000 UNITS CPEP Take 1 capsule (24,000 Units total) by mouth 6 (six) times daily. As needed with each meal or snack 12/22/14  Yes Shawnee Knapp, MD   Social History   Social History  . Marital Status: Married    Spouse Name: N/A  . Number of Children: N/A  . Years of Education: N/A   Occupational History  . Not on file.   Social History Main Topics  . Smoking status: Current Every Day Smoker    Types: Cigarettes  . Smokeless tobacco: Never Used  . Alcohol Use: 0.0 oz/week    0 Standard drinks or equivalent per week     Comment: pt denies use in 2-3 months (11-29-11)  . Drug Use: Yes    Special: Marijuana     Comment: weekends  . Sexual Activity: Not on file   Other Topics Concern  . Not on file   Social History Narrative    Review of Systems  Constitutional: Positive for unexpected weight  change.  Cardiovascular: Negative for chest pain and palpitations.  Gastrointestinal: Negative for abdominal pain, diarrhea and constipation.  Genitourinary: Positive for flank pain. Negative for dysuria, frequency and difficulty urinating.  Musculoskeletal: Negative for neck pain.  Neurological: Positive for light-headedness. Negative for numbness.      Objective:   Physical Exam  Constitutional: He is oriented to person, place, and time. He appears well-developed and well-nourished. No distress.  HENT:  Head: Normocephalic and atraumatic.  Eyes: EOM are normal. Pupils are equal, round, and reactive to light.  Neck: Neck supple.  Cardiovascular: Normal rate, regular rhythm and normal heart  sounds.  Exam reveals no gallop and no friction rub.   No murmur heard. Pulmonary/Chest: Effort normal and breath sounds normal. No respiratory distress. He has no wheezes. He has no rales.  Abdominal: Soft. Bowel sounds are normal. He exhibits no distension. There is tenderness (Generalized tenderness throughout.). There is CVA tenderness, tenderness at McBurney's point and positive Murphy's sign.  Musculoskeletal: He exhibits tenderness (Point tenderness of mid and lower thoracic as well as lower lumbar with BL Para spinal tenderness over thoracic and lumbar. ).  Neurological: He is alert and oriented to person, place, and time. He has normal strength. No cranial nerve deficit.  Reflex Scores:      Patellar reflexes are 2+ on the right side and 2+ on the left side.      Achilles reflexes are 2+ on the right side. Skin: Skin is warm and dry.  Psychiatric: He has a normal mood and affect. His behavior is normal.  Nursing note and vitals reviewed.  BP 120/84 mmHg  Pulse 69  Temp(Src) 98.5 F (36.9 C) (Oral)  Ht 5' 9.25" (1.759 m)  Wt 134 lb (60.782 kg)  BMI 19.64 kg/m2  SpO2 99%  Normal orthostatics.   Results for orders placed or performed in visit on 12/30/14  POCT CBC  Result Value Ref Range   WBC 4.7 4.6 - 10.2 K/uL   Lymph, poc 2.1 0.6 - 3.4   POC LYMPH PERCENT 45.5 10 - 50 %L   MID (cbc) 0.1 0 - 0.9   POC MID % 1.1 0 - 12 %M   POC Granulocyte 2.5 2 - 6.9   Granulocyte percent 53.4 37 - 80 %G   RBC 4.86 4.69 - 6.13 M/uL   Hemoglobin 13.0 (A) 14.1 - 18.1 g/dL   HCT, POC 40.6 (A) 43.5 - 53.7 %   MCV 83.6 80 - 97 fL   MCH, POC 26.7 (A) 27 - 31.2 pg   MCHC 31.9 31.8 - 35.4 g/dL   RDW, POC 16.2 %   Platelet Count, POC 216.0 142 - 424 K/uL   MPV 7.1 0 - 99.8 fL  POCT urinalysis dipstick  Result Value Ref Range   Color, UA Amber    Clarity, UA Clear    Glucose, UA Negative    Bilirubin, UA Negative    Ketones, UA Trace    Spec Grav, UA 1.025    Blood, UA Negative     pH, UA 6.0    Protein, UA Negative    Urobilinogen, UA 0.2    Nitrite, UA Negative    Leukocytes, UA Negative Negative  POCT UA - Microscopic Only  Result Value Ref Range   WBC, Ur, HPF, POC 5-7    RBC, urine, microscopic 0-3    Bacteria, U Microscopic Trace    Mucus, UA Positive    Epithelial cells, urine per micros 15-30  Crystals, Ur, HPF, POC Negative    Casts, Ur, LPF, POC Negative    Yeast, UA Negative    [facility] (PRIMARY) x-ray report read by Dr. Delman Cheadle:  Thoracic spine- Shows degenerative change without acute abnormality.  Lumbar- Moderated stool burden overlying. Again with no significant disc disease or acute abnormality. Between T12 L1 pt does have anterior calcification that appears to be on the left side that is too high to be kidney stone. Pt had [place] recently which showed pancreatic calcifications, but this seems rather lage and prominent to be that. Pt is being evaluated for a 1.5 cm liver mass seen on [place] and has MRI scheduel one week to evaluate this, but calcified lesion seems located too far to the left to be due to liver mass. Will appreciate radiology comments on addition evaluation needed.      Dg Thoracic Spine 2 View  12/30/2014   CLINICAL DATA:  Onset of back and flank pain last week  EXAM: THORACIC SPINE 2 VIEWS  COMPARISON:  PA and lateral chest x-ray dated December 17, 2014  FINDINGS: The thoracic vertebral bodies are preserved in height. The disc space heights are well maintained. The pedicles are intact. There are no abnormal paravertebral soft tissue densities.  IMPRESSION: There is no acute bony abnormality of the thoracic spine.   Electronically Signed   By: David  Martinique M.D.   On: 12/30/2014 11:46   Dg Lumbar Spine 2-3 Views  12/30/2014   CLINICAL DATA:  Severe low back pain and flank pain  EXAM: LUMBAR SPINE - 2-3 VIEW  COMPARISON:  Thoracic spine series of today's date and chest x-ray dated December 17, 2014  FINDINGS: The lumbar vertebral bodies are preserved in  height. The disc space heights are well maintained. There is no spondylolisthesis. The pedicles and transverse processes are intact. There is mild facet joint hypertrophy at L5-S1. There is a chronic coarse calcification that projects in the left upper quadrant. The bowel gas pattern is unremarkable.  IMPRESSION: There is no acute lumbar spine abnormality. There are mild degenerative disc changes at L5-S1. Probable splenic granuloma. No definite urinary tract stones are observed. If there are clinical concerns of kidney stones, abdominal and pelvic CT scanning would be useful.   Electronically Signed   By: David  Martinique M.D.   On: 12/30/2014 11:41   US Abdomen Complete  12/27/2014   CLINICAL DATA:  Severe epigastric abdominal pain, history of pancreatitis and fatty liver disease, possible chronic pancreatitis.  EXAM: ULTRASOUND ABDOMEN COMPLETE  COMPARISON:  Abdominal ultrasound of September 17, 2010  FINDINGS: Gallbladder: No gallstones or wall thickening visualized. No sonographic Murphy sign noted.  Common bile duct: Diameter: 5.5 mm  Liver: The hepatic echotexture is normal. There is a subtle rounded focus of increased echogenicity peripherally with central Iso echogenicity in the right hepatic lobe. No cystic component is observed. There is no intrahepatic ductal dilation.  IVC: No abnormality visualized.  Pancreas: Within the pancreas calcifications are evident in the parenchyma of the head and tail. No soft tissue mass is observed. The pancreatic duct does not appear dilated.  Spleen: Evaluation is spleen was limited by bowel gas.  Right Kidney: Length: 11.6 cm. Echogenicity within normal limits. No mass or hydronephrosis visualized.  Left Kidney: Length: 10.3 cm. Echogenicity within normal limits. No mass or hydronephrosis visualized.  Abdominal aorta: There is mural plaque and calcification without evidence of aneurysm.  Other findings: No ascites was observed.  IMPRESSION: 1. Limited visualization of  the  pancreas. There are calcifications in the head and tail which likely reflect chronic pancreatitis. 2. 1.5 cm focus of rim hyperechogenicity with central iosoechogenicity in the right hepatic lobe. There is no intrahepatic ductal dilation. 3. Abdominal MRI or CT scanning are recommended for further evaluation of the liver and pancreas.   Electronically Signed   By: David  Martinique M.D.   On: 12/27/2014 09:43    Assessment & Plan:   1. Acute thoracic back pain   2. Bilateral low back pain without sciatica   3. Chronic pancreatitis, unspecified pancreatitis type - new diagnosis - has GI referral P  4. Hepatic lesion - newly seen on [place] - ordered MRI to further eval lesion but pt cannot afford with its $860 out of pocket price tag so will leave recommendations on further imaging to GI though I suspect that a CT scan may be an acceptable alternative  5. Hypokalemia   6. Episodic lightheadedness   7. Anemia, unspecified anemia type     Orders Placed This Encounter  Procedures  . DG Lumbar Spine 2-3 Views    Standing Status: Future     Number of Occurrences: 1     Standing Expiration Date: 12/30/2015    Order Specific Question:  Reason for Exam (SYMPTOM  OR DIAGNOSIS REQUIRED)    Answer:  new diffuse pain    Order Specific Question:  Preferred imaging location?    Answer:  External  . DG Thoracic Spine 2 View    Standing Status: Future     Number of Occurrences: 1     Standing Expiration Date: 12/30/2015    Order Specific Question:  Reason for Exam (SYMPTOM  OR DIAGNOSIS REQUIRED)    Answer:  new diffuse pain    Order Specific Question:  Preferred imaging location?    Answer:  External  . Comprehensive metabolic panel  . Lipase  . Ferritin  . Vitamin B12  . Folate  . POCT CBC  . POCT SEDIMENTATION RATE  . POCT urinalysis dipstick  . POCT UA - Microscopic Only    Meds ordered this encounter  Medications  . HYDROcodone-acetaminophen (NORCO/VICODIN) 5-325 MG per tablet    Sig: Take 1-2  tablets by mouth every 6 (six) hours as needed for moderate pain.    Dispense:  40 tablet    Refill:  0  . DISCONTD: cyclobenzaprine (FLEXERIL) 10 MG tablet    Sig: Take 1 tablet (10 mg total) by mouth 3 (three) times daily as needed for muscle spasms.    Dispense:  60 tablet    Refill:  0  . cyclobenzaprine (FLEXERIL) 10 MG tablet    Sig: Take 1 tablet (10 mg total) by mouth 3 (three) times daily as needed for muscle spasms.    Dispense:  60 tablet    Refill:  0   Over 40 min spent in face-to-face evaluation of and consultation with patient and coordination of care.  Over 50% of this time was spent counseling this patient.  I personally performed the services described in this documentation, which was scribed in my presence. The recorded information has been reviewed and considered, and addended by me as needed.  Delman Cheadle, MD MPH

## 2015-01-05 ENCOUNTER — Telehealth: Payer: Self-pay

## 2015-01-05 ENCOUNTER — Inpatient Hospital Stay: Admission: RE | Admit: 2015-01-05 | Payer: BLUE CROSS/BLUE SHIELD | Source: Ambulatory Visit

## 2015-01-05 NOTE — Telephone Encounter (Signed)
Spoke with pt. He is feeling much better and is ready to go back to work full duty. He is off the rest of the week and wants a letter to go back to work starting 8/22. This is NOT a worker's comp patient. Note printed and ready for pick up.

## 2015-01-05 NOTE — Telephone Encounter (Signed)
He needs to be seen for work status changes, correct?

## 2015-01-05 NOTE — Telephone Encounter (Signed)
Pt was given a note to go to work on light duty.  He states they have no light duty and wants a letter to return to work on full duty.  He is a patient of Dr. Brigitte Pulse, who is out of the office for a few weeks.  I gave him the option to be seen or put in a message.  Please call him at [phone]

## 2015-01-17 ENCOUNTER — Encounter: Payer: Self-pay | Admitting: Family Medicine

## 2015-01-19 ENCOUNTER — Encounter: Payer: Self-pay | Admitting: Gastroenterology

## 2015-01-19 ENCOUNTER — Ambulatory Visit (INDEPENDENT_AMBULATORY_CARE_PROVIDER_SITE_OTHER): Payer: BLUE CROSS/BLUE SHIELD | Admitting: Gastroenterology

## 2015-01-19 VITALS — BP 100/70 | HR 84 | Ht 69.25 in | Wt 136.4 lb

## 2015-01-19 DIAGNOSIS — K7689 Other specified diseases of liver: Secondary | ICD-10-CM

## 2015-01-19 DIAGNOSIS — K861 Other chronic pancreatitis: Secondary | ICD-10-CM | POA: Diagnosis not present

## 2015-01-19 DIAGNOSIS — Z1211 Encounter for screening for malignant neoplasm of colon: Secondary | ICD-10-CM | POA: Insufficient documentation

## 2015-01-19 DIAGNOSIS — K769 Liver disease, unspecified: Secondary | ICD-10-CM | POA: Insufficient documentation

## 2015-01-19 NOTE — Progress Notes (Signed)
01/19/2015 Joseph Hess [id number] 03/24/1966   HISTORY OF PRESENT ILLNESS:  This is a 49 year old male who is new to our practice and referred by his PCP, Dr. Delman Cheadle. He has what appears to be chronic pancreatitis. Had some episodes of acute pancreatitis in the past, likely related to his previous alcohol use. He has not used any alcohol at all since December 2015 according to his report. He had a recent ultrasound of the abdomen earlier this month, which showed calcifications in the head and tail of the pancreas which likely reflects chronic pancreatitis. Also showed a 1.5 cm focus of rim hyperechogenicity with centralized isoechogenicity in the right hepatic lobe. They recommended MRI or CT scan for further evaluation of the liver and the pancreas. He was scheduled for an MRI by his PCP, but he could not afford this so he cancelled the study.  He has previous ultrasound in 2012 that suggested hepatic steatosis, but no definite liver cirrhosis by imaging or labs. He does have intermittent epigastric abdominal pains. He was recently placed on pancreatic enzymes with lipase of 24,000 units. He was told take one of these 6 times daily. He admits to some diarrhea as well, likely related to chronic pancreatitis.   Past Medical History  Diagnosis Date  . Pancreatitis    Past Surgical History  Procedure Laterality Date  . Wisdom tooth extraction      reports that he has been smoking Cigarettes.  He has never used smokeless tobacco. He reports that he uses illicit drugs (Marijuana). He reports that he does not drink alcohol. family history includes Diabetes in his maternal grandmother; Hypertension in his mother. Allergies  Allergen Reactions  . Penicillins     Reaction unknown to pt      Outpatient Encounter Prescriptions as of 01/19/2015  Medication Sig  . cyclobenzaprine (FLEXERIL) 10 MG tablet Take 1 tablet (10 mg total) by mouth 3 (three) times daily as needed for muscle spasms.    Marland Kitchen HYDROcodone-acetaminophen (NORCO/VICODIN) 5-325 MG per tablet Take 1-2 tablets by mouth every 6 (six) hours as needed for moderate pain.  . Pancrelipase, Lip-Prot-Amyl, 24000 UNITS CPEP 3 capsules by mouth with meals and at snack time  . [DISCONTINUED] Pancrelipase, Lip-Prot-Amyl, 24000 UNITS CPEP Take 1 capsule (24,000 Units total) by mouth 6 (six) times daily. As needed with each meal or snack   No facility-administered encounter medications on file as of 01/19/2015.     REVIEW OF SYSTEMS  : All other systems reviewed and negative except where noted in the History of Present Illness.   PHYSICAL EXAM: BP 100/70 mmHg  Pulse 84  Ht 5' 9.25" (1.759 m)  Wt 136 lb 6 oz (61.859 kg)  BMI 19.99 kg/m2 General: Well developed black male in no acute distress Head: Normocephalic and atraumatic Eyes:  Sclerae anicteric,conjunctiva pink. Ears: Normal auditory acuity Lungs: Clear throughout to auscultation Heart: Regular rate and rhythm Abdomen: Soft, non-distended.  Normal bowel sounds.  Mild epigastric TTP without R/R/G. Rectal:  Will be done at the time of colonoscopy. Musculoskeletal: Symmetrical with no gross deformities  Skin: No lesions on visible extremities Extremities: No edema  Neurological: Alert oriented x 4, grossly non-focal Psychological:  Alert and cooperative. Normal mood and affect  ASSESSMENT AND PLAN: -Chronic pancreatitis:  With intermittent epigastric abdominal pain and some diarrhea.  Now on pancreatic enzymes but will change/increase the dosing on those.  He has 24,000 units, so we will have him take three of those  with meals and 1-2 with snacks. -Liver lesion:  1.5 cm in the right hepatic lobe.  Has history of possible hepatic steatosis but no evidence of cirrhosis.   -Previous ETOH abuse:  No ETOH at all since 04/2014. -Screening colonoscopy:  Will schedule with Dr.Gessner.  The risks, benefits, and alternatives to colonoscopy were discussed with the patient and he  consents to proceed.   *MRI of the abdomen was scheduled for the patient by his PCP, however, he could not afford to have that performed. Due to needing further evaluation of the pancreas and this possible liver lesion we will order a CT scan of the abdomen, which will likely be cheaper for him.  CC:  Shawnee Knapp, MD

## 2015-01-19 NOTE — Patient Instructions (Signed)
You have been scheduled for a CT scan of the abdomen and pelvis at [facility] ([address]---this is in the same building as [facility]).   You are scheduled on 01/21/2015 at 1:30pm. You should arrive 15 minutes prior to your appointment time for registration. Please follow the written instructions below on the day of your exam:  WARNING: IF YOU ARE ALLERGIC TO IODINE/X-RAY DYE, PLEASE NOTIFY RADIOLOGY IMMEDIATELY AT [phone]! YOU WILL BE GIVEN A 13 HOUR PREMEDICATION PREP.  1) Do not eat or drink anything after 9:30am (4 hours prior to your test) 2) You have been given 2 bottles of oral contrast to drink. The solution may taste               better if refrigerated, but do NOT add ice or any other liquid to this solution. Shake             well before drinking.      You may take any medications as prescribed with a small amount of water except for the following: Metformin, Glucophage, Glucovance, Avandamet, Riomet, Fortamet, Actoplus Met, Janumet, Glumetza or Metaglip. The above medications must be held the day of the exam AND 48 hours after the exam.  The purpose of you drinking the oral contrast is to aid in the visualization of your intestinal tract. The contrast solution may cause some diarrhea. Before your exam is started, you will be given a small amount of fluid to drink. Depending on your individual set of symptoms, you may also receive an intravenous injection of x-ray contrast/dye. Plan on being at [facility] for 30 minutes or long, depending on the type of exam you are having performed.  This test typically takes 30-45 minutes to complete.  If you have any questions regarding your exam or if you need to reschedule, you may call the CT department at [phone] between the hours of 8:00 am and 5:00 pm, Monday-Friday.  ________________________________________________________________________   Joseph Hess have been scheduled for a colonoscopy. Please follow  written instructions given to you at your visit today.  Please pick up your prep supplies at the pharmacy within the next 1-3 days. If you use inhalers (even only as needed), please bring them with you on the day of your procedure. Your physician has requested that you go to www.startemmi.com and enter the access code given to you at your visit today. This web site gives a general overview about your procedure. However, you should still follow specific instructions given to you by our office regarding your preparation for the procedure.

## 2015-01-21 ENCOUNTER — Ambulatory Visit (INDEPENDENT_AMBULATORY_CARE_PROVIDER_SITE_OTHER)
Admission: RE | Admit: 2015-01-21 | Discharge: 2015-01-21 | Disposition: A | Discharge status: Home / Self Care | Payer: BLUE CROSS/BLUE SHIELD | Source: Ambulatory Visit | Attending: Gastroenterology | Admitting: Gastroenterology

## 2015-01-21 DIAGNOSIS — K861 Other chronic pancreatitis: Secondary | ICD-10-CM | POA: Diagnosis not present

## 2015-01-21 MED ORDER — IOHEXOL 300 MG/ML  SOLN
100.0000 mL | Freq: Once | INTRAMUSCULAR | Status: AC | PRN
  Administered 2015-01-21: 100 mL via INTRAVENOUS

## 2015-01-25 ENCOUNTER — Telehealth: Payer: Self-pay | Admitting: *Deleted

## 2015-01-25 NOTE — Telephone Encounter (Signed)
I called the patient to advise, per Dr. Carlean Purl that the liver lesion thought to be a benign hemangioma is not cancer and should never be a problem.  Advised the patient Dr. Carlean Purl will explain more at his colonoscopy. Patient thanked me for calling.

## 2015-01-25 NOTE — Progress Notes (Signed)
IMPRESSION: 1. Coarse calcifications in the pancreatic head, neck and tail. Pancreatic parenchymal atrophy with associated main pancreatic duct dilation in the pancreatic tail distal to the coarse pancreatic tail calcification. No discrete pancreatic mass. These findings are most in keeping with chronic pancreatitis. No findings of acute pancreatitis. Normal gallbladder, with no biliary ductal dilatation. 2. 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 Recommend further evaluation with liver mass protocol MRI of the abdomen with and without intravenous contrast to definitively characterize this right liver lobe mass. 3. No evidence of bowel obstruction or acute bowel inflammation.    Would not do MR at this point he cannot afford I will have staff Jeannene Patella P)contact him with these results and explain more at colonoscopy  Gatha Mayer, MD, [facility]

## 2015-01-25 NOTE — Progress Notes (Signed)
Agree with Ms. Zehr's management.  Jaken Fregia E. Dhilan Brauer, MD, FACG  

## 2015-01-25 NOTE — Telephone Encounter (Signed)
-----   Message from Gatha Mayer, MD sent at 01/25/2015  3:20 PM EDT ----- Regarding: message for patient Please tell him lever lesion thought to be a benign hemangioma not cancer and should never be a problem   I will explain more at his colonoscopy

## 2015-02-04 ENCOUNTER — Other Ambulatory Visit: Payer: Self-pay | Admitting: *Deleted

## 2015-02-04 DIAGNOSIS — R160 Hepatomegaly, not elsewhere classified: Secondary | ICD-10-CM

## 2015-02-15 ENCOUNTER — Ambulatory Visit (HOSPITAL_COMMUNITY)
Admission: RE | Admit: 2015-02-15 | Discharge: 2015-02-15 | Disposition: A | Discharge status: Home / Self Care | Payer: BLUE CROSS/BLUE SHIELD | Source: Ambulatory Visit | Attending: Gastroenterology | Admitting: Gastroenterology

## 2015-02-15 DIAGNOSIS — R1013 Epigastric pain: Secondary | ICD-10-CM | POA: Insufficient documentation

## 2015-02-15 DIAGNOSIS — K769 Liver disease, unspecified: Secondary | ICD-10-CM | POA: Insufficient documentation

## 2015-02-15 DIAGNOSIS — R112 Nausea with vomiting, unspecified: Secondary | ICD-10-CM | POA: Insufficient documentation

## 2015-02-15 DIAGNOSIS — K861 Other chronic pancreatitis: Secondary | ICD-10-CM | POA: Diagnosis not present

## 2015-02-15 DIAGNOSIS — R160 Hepatomegaly, not elsewhere classified: Secondary | ICD-10-CM

## 2015-02-15 MED ORDER — GADOBENATE DIMEGLUMINE 529 MG/ML IV SOLN
15.0000 mL | Freq: Once | INTRAVENOUS | Status: AC | PRN
  Administered 2015-02-15: 12 mL via INTRAVENOUS

## 2015-03-01 ENCOUNTER — Other Ambulatory Visit: Payer: Self-pay | Admitting: Family Medicine

## 2015-03-07 ENCOUNTER — Telehealth: Payer: Self-pay

## 2015-03-07 NOTE — Telephone Encounter (Signed)
Patient given results

## 2015-03-07 NOTE — Telephone Encounter (Signed)
Pt called about MRI results. Advised to call [facility] as it appears they are the ones who ordered it.

## 2015-03-07 NOTE — Telephone Encounter (Signed)
Please call the patient regarding his MRI results. I thought we had already addressed this but apparently not. 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 No further surveillance/follow-up for this as needed. Also shows chronic pancreatitis which we already knows that he has.  Thank you,  Jess

## 2015-03-23 ENCOUNTER — Ambulatory Visit (AMBULATORY_SURGERY_CENTER): Payer: BLUE CROSS/BLUE SHIELD | Admitting: Internal Medicine

## 2015-03-23 ENCOUNTER — Encounter: Payer: Self-pay | Admitting: Internal Medicine

## 2015-03-23 VITALS — BP 132/80 | HR 66 | Temp 98.5°F | Resp 14 | Ht 69.0 in | Wt 136.0 lb

## 2015-03-23 DIAGNOSIS — D129 Benign neoplasm of anus and anal canal: Secondary | ICD-10-CM

## 2015-03-23 DIAGNOSIS — K635 Polyp of colon: Secondary | ICD-10-CM

## 2015-03-23 DIAGNOSIS — D125 Benign neoplasm of sigmoid colon: Secondary | ICD-10-CM | POA: Diagnosis not present

## 2015-03-23 DIAGNOSIS — D123 Benign neoplasm of transverse colon: Secondary | ICD-10-CM | POA: Diagnosis not present

## 2015-03-23 DIAGNOSIS — D128 Benign neoplasm of rectum: Secondary | ICD-10-CM | POA: Diagnosis not present

## 2015-03-23 DIAGNOSIS — Z1211 Encounter for screening for malignant neoplasm of colon: Secondary | ICD-10-CM | POA: Diagnosis present

## 2015-03-23 MED ORDER — SODIUM CHLORIDE 0.9 % IV SOLN: 500.0000 mL | INTRAVENOUS | Status: DC

## 2015-03-23 MED ORDER — PANCRELIPASE (LIP-PROT-AMYL) 40000-136000 UNITS PO CPEP: ORAL_CAPSULE | ORAL | Status: DC

## 2015-03-23 NOTE — Progress Notes (Signed)
No problems noted in the recovery room. maw 

## 2015-03-23 NOTE — Progress Notes (Signed)
Report to PACU, RN, vss, BBS= Clear.  

## 2015-03-23 NOTE — Progress Notes (Signed)
Pt's mother has called for their ride home.  They will wait in the conference room until ride arrives. maw

## 2015-03-23 NOTE — Patient Instructions (Addendum)
I found and removed 3 polyps. I will let you know pathology results and when to have another routine colonoscopy by mail.   I prescribed the pancreatic enzymes at a higher dose - please go o your pharmacy to pick up.  I appreciate the opportunity to care for you. Gatha Mayer, MD, FACG      YOU HAD AN ENDOSCOPIC PROCEDURE TODAY AT [facility]:   Refer to the procedure report that was given to you for any specific questions about what was found during the examination.  If the procedure report does not answer your questions, please call your gastroenterologist to clarify.  If you requested that your care partner not be given the details of your procedure findings, then the procedure report has been included in a sealed envelope for you to review at your convenience later.  YOU SHOULD EXPECT: Some feelings of bloating in the abdomen. Passage of more gas than usual.  Walking can help get rid of the air that was put into your GI tract during the procedure and reduce the bloating. If you had a lower endoscopy (such as a colonoscopy or flexible sigmoidoscopy) you may notice spotting of blood in your stool or on the toilet paper. If you underwent a bowel prep for your procedure, you may not have a normal bowel movement for a few days.  Please Note:  You might notice some irritation and congestion in your nose or some drainage.  This is from the oxygen used during your procedure.  There is no need for concern and it should clear up in a day or so.  SYMPTOMS TO REPORT IMMEDIATELY:   Following lower endoscopy (colonoscopy or flexible sigmoidoscopy):  Excessive amounts of blood in the stool  Significant tenderness or worsening of abdominal pains  Swelling of the abdomen that is new, acute  Fever of 100F or higher  For urgent or emergent issues, a gastroenterologist can be reached at any hour by calling [phone].   DIET: Your first meal following the procedure  should be a small meal and then it is ok to progress to your normal diet. Heavy or fried foods are harder to digest and may make you feel nauseous or bloated.  Likewise, meals heavy in dairy and vegetables can increase bloating.  Drink plenty of fluids but you should avoid alcoholic beverages for 24 hours.  ACTIVITY:  You should plan to take it easy for the rest of today and you should NOT DRIVE or use heavy machinery until tomorrow (because of the sedation medicines used during the test).    FOLLOW UP: Our staff will call the number listed on your records the next business day following your procedure to check on you and address any questions or concerns that you may have regarding the information given to you following your procedure. If we do not reach you, we will leave a message.  However, if you are feeling well and you are not experiencing any problems, there is no need to return our call.  We will assume that you have returned to your regular daily activities without incident.  If any biopsies were taken you will be contacted by phone or by letter within the next 1-3 weeks.  Please call us at [phone] if you have not heard about the biopsies in 3 weeks.    SIGNATURES/CONFIDENTIALITY: You and/or your care partner have signed paperwork which will be entered into your electronic medical record.  These signatures  attest to the fact that that the information above on your After Visit Summary has been reviewed and is understood.  Full responsibility of the confidentiality of this discharge information lies with you and/or your care-partner.    Handout was given to your care partner on polyps. Hold aspirin and all other NSAIDs for 2 weeks. Per Dr. Carlean Purl he changed ZEN-PEP to 40KU 2 with meals and 1 with snacks. You may resume your current medications today. Await biopsy results. Please call if any questions or concerns.

## 2015-03-23 NOTE — Progress Notes (Signed)
Called to room to assist during endoscopic procedure.  Patient ID and intended procedure confirmed with present staff. Received instructions for my participation in the procedure from the performing physician.  

## 2015-03-23 NOTE — Op Note (Signed)
[place]  [address]. [place], [address]   COLONOSCOPY PROCEDURE REPORT  PATIENT: Joseph Hess, Joseph Hess  MR#: [id number] BIRTHDATE: 02-15-1966 , 3  yrs. old GENDER: male ENDOSCOPIST: Gatha Mayer, MD, [facility] PROCEDURE DATE:  03/23/2015 PROCEDURE:   Colonoscopy, screening and Colonoscopy with snare polypectomy First Screening Colonoscopy - Avg.  risk and is 50 yrs.  old or older Yes.  Prior Negative Screening - Now for repeat screening. N/A  History of Adenoma - Now for follow-up colonoscopy & has been > or = to 3 yrs.  N/A  Polyps removed today? Yes ASA CLASS:   Class II INDICATIONS:Screening for colonic neoplasia and Colorectal Neoplasm Risk Assessment for this procedure is average risk. MEDICATIONS: Propofol 360 mg IV and Monitored anesthesia care  DESCRIPTION OF PROCEDURE:   After the risks benefits and alternatives of the procedure were thoroughly explained, informed consent was obtained.  The digital rectal exam revealed no abnormalities of the rectum.   The LB [id number]  endoscope was introduced through the anus and advanced to the cecum, which was identified by both the appendix and ileocecal valve. No adverse events experienced.   The quality of the prep was (MiraLax was used) adequate  The instrument was then slowly withdrawn as the colon was fully examined. Estimated blood loss is zero unless otherwise noted in this procedure report.   COLON FINDINGS: Three polypoid shaped semi-pedunculated polyps ranging from 5 to 17mm in size were found in the rectum and sigmoid colon.  Polypectomies were performed using snare cautery and with a cold snare.  The resection was complete, the polyp tissue was completely retrieved and sent to histology.   The examination was otherwise normal.  Retroflexed views revealed no abnormalities. The time to cecum = 5.8 Withdrawal time = 18.2   The scope was withdrawn and the procedure completed. COMPLICATIONS: There  were no immediate complications.  ENDOSCOPIC IMPRESSION: 1.   Three semi-pedunculated polyps ranging from 5 to 75mm in size were found in the rectum and sigmoid colon; polypectomies were performed using snare cautery and with a cold snare 2.   The examination was otherwise normal - adequate prep  RECOMMENDATIONS: 1.  Hold Aspirin and all other NSAIDS for 2 weeks. 2.  Timing of repeat colonoscopy will be determined by pathology findings. 3.  I changed Zen-Pep to 40KU 2 w/ meals 1 w/ snacks  eSigned:  Gatha Mayer, MD, [facility] 03/23/2015 2:26 PM   cc: Delman Cheadle, MD and The Patient

## 2015-03-24 ENCOUNTER — Telehealth: Payer: Self-pay | Admitting: Emergency Medicine

## 2015-03-24 NOTE — Telephone Encounter (Signed)
Left message, no identifier 

## 2015-03-30 ENCOUNTER — Encounter: Payer: Self-pay | Admitting: Internal Medicine

## 2015-03-30 DIAGNOSIS — Z860101 Personal history of adenomatous and serrated colon polyps: Secondary | ICD-10-CM

## 2015-03-30 DIAGNOSIS — Z86010 Personal history of colonic polyps: Secondary | ICD-10-CM

## 2015-03-30 HISTORY — DX: Personal history of colonic polyps: Z86.010

## 2015-03-30 HISTORY — DX: Personal history of adenomatous and serrated colon polyps: Z86.0101

## 2015-03-30 NOTE — Progress Notes (Signed)
Quick Note:  3 adenomas max 10 mm - repeat colon 2019 ______

## 2015-11-18 ENCOUNTER — Emergency Department: Admit: 2015-11-19 | Payer: PRIVATE HEALTH INSURANCE | Primary: Obstetrics & Gynecology

## 2015-11-18 ENCOUNTER — Inpatient Hospital Stay
Admit: 2015-11-18 | Discharge: 2015-11-19 | Disposition: A | Discharge status: Home / Self Care | Payer: PRIVATE HEALTH INSURANCE | Attending: Specialist

## 2015-11-18 DIAGNOSIS — I499 Cardiac arrhythmia, unspecified: Secondary | ICD-10-CM

## 2015-11-18 NOTE — ED Provider Notes (Signed)
HPI Comments: Jonathan Tate is a 50 y.o. male who was brought by EMS to the ED with complaint of palpitations that began less than an hour ago. The pt snorted heroin and cocaine about an hour ago and began having these heart palpitations. He is having SOB. Nothing has alleviated his symptoms. His pain does not radiate. He is feeling lightheaded, but has not experienced syncope. He is anxious because of these symptoms. Denies CP, abdominal pain, syncope, vision changes, nausea, vomiting, diarrhea, fevers, chills.    Patient is a 50 y.o. male presenting with palpitations. The history is provided by the patient.   Palpitations    This is a new problem. The current episode started less than 1 hour ago. The problem has not changed since onset.The problem occurs constantly. The problem is associated with cocaine. Associated symptoms include shortness of breath. Pertinent negatives include no fever, no numbness, no chest pain, no abdominal pain, no nausea, no vomiting, no dizziness, no weakness and no cough. He has tried nothing for the symptoms.        History reviewed. No pertinent past medical history.    History reviewed. No pertinent surgical history.      History reviewed. No pertinent family history.    Social History     Social History   ??? Marital status: N/A     Spouse name: N/A   ??? Number of children: N/A   ??? Years of education: N/A     Occupational History   ??? Not on file.     Social History Main Topics   ??? Smoking status: Never Smoker   ??? Smokeless tobacco: Never Used   ??? Alcohol use No   ??? Drug use: Yes     Special: Heroin, Cocaine   ??? Sexual activity: Not Currently     Other Topics Concern   ??? Not on file     Social History Narrative   ??? No narrative on file         ALLERGIES: Review of patient's allergies indicates no known allergies.    Review of Systems   Constitutional: Negative.  Negative for chills and fever.   HENT: Negative.  Negative for postnasal drip, rhinorrhea and sore throat.     Eyes: Negative.  Negative for pain and discharge.   Respiratory: Positive for shortness of breath. Negative for cough and chest tightness.    Cardiovascular: Positive for palpitations. Negative for chest pain.   Gastrointestinal: Negative.  Negative for abdominal pain, diarrhea, nausea and vomiting.   Genitourinary: Negative.  Negative for dysuria, flank pain and frequency.   Musculoskeletal: Negative.  Negative for arthralgias and myalgias.   Skin: Negative.  Negative for rash.   Allergic/Immunologic: Negative.  Negative for immunocompromised state.   Neurological: Positive for light-headedness. Negative for dizziness, weakness and numbness.   All other systems reviewed and are negative.      Vitals:    11/18/15 1942 11/18/15 1944 11/18/15 1946 11/18/15 1951   BP: (!) 150/101      Pulse: (!) 120      Resp: (!) 1      Temp:  99.4 ??F (37.4 ??C)     SpO2: 95%   100%   Weight:   74.8 kg (165 lb)    Height:   6' (1.829 m)             Physical Exam   Constitutional: He is oriented to person, place, and time. He appears well-developed and well-nourished. No  distress.   HENT:   Head: Normocephalic and atraumatic.   Mouth/Throat: Oropharynx is clear and moist. No oropharyngeal exudate.   Eyes: Conjunctivae and EOM are normal. Pupils are equal, round, and reactive to light. Right eye exhibits no discharge. Left eye exhibits no discharge. No scleral icterus.   Neck: Normal range of motion. Neck supple. No thyromegaly present.   Cardiovascular: Regular rhythm, normal heart sounds and intact distal pulses.  Tachycardia present.  Exam reveals no gallop and no friction rub.    No murmur heard.  Pulmonary/Chest: Breath sounds normal. No respiratory distress. He has no wheezes. He has no rales.   Abdominal: Soft. Bowel sounds are normal. He exhibits no distension. There is no tenderness. There is no rebound and no guarding.   Musculoskeletal: Normal range of motion. He exhibits no edema or tenderness.   Lymphadenopathy:      He has no cervical adenopathy.   Neurological: He is alert and oriented to person, place, and time.   Skin: Skin is warm and dry. No rash noted. He is not diaphoretic.   Psychiatric: His behavior is normal. Judgment and thought content normal. His mood appears anxious.   Nursing note and vitals reviewed.       MDM  Number of Diagnoses or Management Options  Cardiac arrhythmia, unspecified:   Polysubstance abuse:   Diagnosis management comments: 50 y.o. male with known hx of polysubstance abuse complaining of severe palpitations and SOB post-heroin and cocaine use. Suspect drug induced cardio arrhythmia. Rule out MI.  Cardiac arrhythmia, unspecified  (primary encounter diagnosis)  Polysubstance abuse    Plan: Discharge Patient with written instructions. Medications for this visit No current facility-administered medications for this encounter.   No current outpatient prescriptions on file.  Labs  Hydrate  Re-evaluate   -----  8:05 PM  Documented by Pieter Partridgeichard A Skelton, acting as a scribe for Dr. Marcha Soldersonovan B Dilraj Killgore, MD      PROVIDER ATTESTATION:  11:51 PM    The entirety of this note, signed by me, accurately reflects all works, treatments, procedures, and medical decision making performed by me, Marcha Soldersonovan B Breta Demedeiros, MD.          Patient Progress  Patient progress: stable    ED Course     Diagnostic Studies:      Lab Data:  Labs Reviewed   METABOLIC PANEL, COMPREHENSIVE   CBC WITH AUTOMATED DIFF         ED Course:       Procedures

## 2015-11-18 NOTE — ED Triage Notes (Signed)
Ingested heroin and cocaine, then dev heart palpitations. Denies chest pain or SOB.

## 2015-11-19 LAB — DRUG SCREEN, URINE
AMPHETAMINES: NEGATIVE
BARBITURATES: NEGATIVE
BENZODIAZEPINES: NEGATIVE
COCAINE: POSITIVE — AB
OPIATES: POSITIVE — AB
PCP(PHENCYCLIDINE): NEGATIVE
THC (TH-CANNABINOL): NEGATIVE
TRICYCLICS: NEGATIVE

## 2015-11-19 LAB — URINALYSIS W/ RFLX MICROSCOPIC
Bilirubin: NEGATIVE
Blood: NEGATIVE
Glucose: NEGATIVE mg/dL
Ketone: NEGATIVE mg/dL
Nitrites: NEGATIVE
Specific gravity: 1.01 (ref 1.003–1.030)
Urobilinogen: 1 EU/dL (ref 0.2–1.0)
pH (UA): 7 (ref 5.0–7.0)

## 2015-11-19 LAB — URINE MICROSCOPIC
Casts: NONE SEEN /lpf
Crystals, urine: NONE SEEN /LPF
RBC: NEGATIVE /hpf

## 2015-11-19 LAB — CBC WITH AUTOMATED DIFF
ABS. BASOPHILS: 0 10*3/uL (ref 0.0–0.2)
ABS. EOSINOPHILS: 0.1 10*3/uL (ref 0.0–0.7)
ABS. LYMPHOCYTES: 1.5 10*3/uL (ref 1.2–3.4)
ABS. MONOCYTES: 0.6 10*3/uL — ABNORMAL LOW (ref 1.1–3.2)
ABS. NEUTROPHILS: 7.3 10*3/uL — ABNORMAL HIGH (ref 1.4–6.5)
BASOPHILS: 0 % (ref 0–2)
EOSINOPHILS: 1 % (ref 0–5)
HCT: 40.6 % (ref 36.8–45.2)
HGB: 13.8 g/dL (ref 12.8–15.0)
IMMATURE GRANULOCYTES: 0.2 % (ref 0.0–5.0)
LYMPHOCYTES: 16 % (ref 16–40)
MCH: 29.9 PG (ref 27–31)
MCHC: 34 g/dL (ref 32–36)
MCV: 88.1 FL (ref 81–99)
MONOCYTES: 6 % (ref 0–12)
MPV: 10.1 FL (ref 7.4–10.4)
NEUTROPHILS: 77 % — ABNORMAL HIGH (ref 40–70)
PLATELET: 219 10*3/uL (ref 140–450)
RBC: 4.61 M/uL (ref 4.0–5.2)
RDW: 14.3 % (ref 11.5–14.5)
WBC: 9.5 10*3/uL (ref 4.8–10.8)

## 2015-11-19 LAB — METABOLIC PANEL, COMPREHENSIVE
A-G Ratio: 1.1 (ref 1.0–3.1)
ALT (SGPT): 28 U/L (ref 12.0–78.0)
AST (SGOT): 24 U/L (ref 15–37)
Albumin: 3.4 g/dL (ref 3.40–5.00)
Alk. phosphatase: 61 U/L (ref 46–116)
Anion gap: 15 mmol/L (ref 10–17)
BUN/Creatinine ratio: 8 (ref 6.0–20.0)
BUN: 12 MG/DL (ref 7–18)
Bilirubin, total: 0.3 MG/DL (ref 0.20–1.00)
CO2: 29 mmol/L (ref 21–32)
Calcium: 8.9 MG/DL (ref 8.5–10.1)
Chloride: 104 mmol/L (ref 98–107)
Creatinine: 1.49 MG/DL — ABNORMAL HIGH (ref 0.6–1.3)
GFR est AA: >60 mL/min/{1.73_m2} (ref >60)
GFR est non-AA: 54 mL/min/{1.73_m2} — ABNORMAL LOW (ref >60)
Globulin: 3.2 g/dL
Glucose: 145 mg/dL — ABNORMAL HIGH (ref 74–106)
Potassium: 3.7 mmol/L (ref 3.50–5.10)
Protein, total: 6.6 g/dL (ref 6.40–8.20)
Sodium: 144 mmol/L (ref 136–145)

## 2015-11-19 LAB — TROPONIN I: Troponin-I, Qt.: <0.02 ng/mL (ref 0.00–0.08)

## 2015-11-19 NOTE — ED Notes (Signed)
Discharged from ED ambulatory, discharge instructions given. Verbalized understanding of discharge instructions. All belongings taken by patient at time of departure.

## 2015-11-19 NOTE — ED Notes (Signed)
States feeling better, denies pain. Requesting discharge.

## 2015-11-21 LAB — EKG, 12 LEAD, INITIAL
Atrial Rate: 108 {beats}/min
Calculated P Axis: 68 degrees
Calculated R Axis: 16 degrees
Calculated T Axis: 53 degrees
P-R Interval: 128 ms
Q-T Interval: 342 ms
QRS Duration: 84 ms
QTC Calculation (Bezet): 458 ms
Ventricular Rate: 108 {beats}/min

## 2015-11-21 LAB — EKG 12-LEAD
Atrial Rate: 108 {beats}/min
P Axis: 68 degrees
P-R Interval: 128 ms
Q-T Interval: 342 ms
QRS Duration: 84 ms
QTc Calculation (Bazett): 458 ms
R Axis: 16 degrees
T Axis: 53 degrees
Ventricular Rate: 108 {beats}/min

## 2018-07-24 ENCOUNTER — Encounter: Payer: Self-pay | Admitting: Internal Medicine

## 2018-12-23 ENCOUNTER — Other Ambulatory Visit: Payer: Self-pay

## 2018-12-23 ENCOUNTER — Encounter (HOSPITAL_COMMUNITY): Payer: Self-pay

## 2018-12-23 ENCOUNTER — Emergency Department (HOSPITAL_COMMUNITY)
Admission: EM | Admit: 2018-12-23 | Discharge: 2018-12-23 | Disposition: A | Discharge status: Home / Self Care | Payer: BC Managed Care – PPO | Attending: Emergency Medicine | Admitting: Emergency Medicine

## 2018-12-23 ENCOUNTER — Emergency Department (HOSPITAL_COMMUNITY): Payer: BC Managed Care – PPO

## 2018-12-23 DIAGNOSIS — R030 Elevated blood-pressure reading, without diagnosis of hypertension: Secondary | ICD-10-CM | POA: Insufficient documentation

## 2018-12-23 DIAGNOSIS — F17210 Nicotine dependence, cigarettes, uncomplicated: Secondary | ICD-10-CM | POA: Diagnosis not present

## 2018-12-23 DIAGNOSIS — R0602 Shortness of breath: Secondary | ICD-10-CM | POA: Diagnosis present

## 2018-12-23 DIAGNOSIS — R0600 Dyspnea, unspecified: Secondary | ICD-10-CM

## 2018-12-23 DIAGNOSIS — R0789 Other chest pain: Secondary | ICD-10-CM | POA: Insufficient documentation

## 2018-12-23 LAB — COMPREHENSIVE METABOLIC PANEL
ALT: 22 U/L (ref 0–44)
AST: 16 U/L (ref 15–41)
Albumin: 3.9 g/dL (ref 3.5–5.0)
Alkaline Phosphatase: 88 U/L (ref 38–126)
Anion gap: 8 (ref 5–15)
BUN: 10 mg/dL (ref 6–20)
CO2: 25 mmol/L (ref 22–32)
Calcium: 9 mg/dL (ref 8.9–10.3)
Chloride: 106 mmol/L (ref 98–111)
Creatinine, Ser: 0.74 mg/dL (ref 0.61–1.24)
GFR calc Af Amer: >60 mL/min (ref >60)
GFR calc non Af Amer: >60 mL/min (ref >60)
Glucose, Bld: 147 mg/dL — ABNORMAL HIGH (ref 70–99)
Potassium: 3.4 mmol/L — ABNORMAL LOW (ref 3.5–5.1)
Sodium: 139 mmol/L (ref 135–145)
Total Bilirubin: 0.3 mg/dL (ref 0.3–1.2)
Total Protein: 7.2 g/dL (ref 6.5–8.1)

## 2018-12-23 LAB — CBC WITH DIFFERENTIAL/PLATELET
Abs Immature Granulocytes: 0.01 10*3/uL (ref 0.00–0.07)
Basophils Absolute: 0.1 10*3/uL (ref 0.0–0.1)
Basophils Relative: 1 %
Eosinophils Absolute: 0.2 10*3/uL (ref 0.0–0.5)
Eosinophils Relative: 3 %
HCT: 43.3 % (ref 39.0–52.0)
Hemoglobin: 13.4 g/dL (ref 13.0–17.0)
Immature Granulocytes: 0 %
Lymphocytes Relative: 25 %
Lymphs Abs: 1.6 10*3/uL (ref 0.7–4.0)
MCH: 26.9 pg (ref 26.0–34.0)
MCHC: 30.9 g/dL (ref 30.0–36.0)
MCV: 86.8 fL (ref 80.0–100.0)
Monocytes Absolute: 0.3 10*3/uL (ref 0.1–1.0)
Monocytes Relative: 5 %
Neutro Abs: 4.2 10*3/uL (ref 1.7–7.7)
Neutrophils Relative %: 66 %
Platelets: 261 10*3/uL (ref 150–400)
RBC: 4.99 MIL/uL (ref 4.22–5.81)
RDW: 14.5 % (ref 11.5–15.5)
WBC: 6.3 10*3/uL (ref 4.0–10.5)
nRBC: 0 % (ref 0.0–0.2)

## 2018-12-23 LAB — PROTIME-INR
INR: 0.9 (ref 0.8–1.2)
Prothrombin Time: 12 seconds (ref 11.4–15.2)

## 2018-12-23 LAB — BRAIN NATRIURETIC PEPTIDE: B Natriuretic Peptide: 81 pg/mL (ref 0.0–100.0)

## 2018-12-23 MED ORDER — ALBUTEROL SULFATE (2.5 MG/3ML) 0.083% IN NEBU: 5.0000 mg | INHALATION_SOLUTION | Freq: Once | RESPIRATORY_TRACT | Status: DC

## 2018-12-23 MED ORDER — ALBUTEROL SULFATE HFA 108 (90 BASE) MCG/ACT IN AERS
4.0000 | INHALATION_SPRAY | Freq: Once | RESPIRATORY_TRACT | Status: AC
  Administered 2018-12-23: 4 via RESPIRATORY_TRACT
  Filled 2018-12-23: qty 6.7

## 2018-12-23 NOTE — ED Notes (Signed)
Pt ambulated to treatment room w/o difficulty.

## 2018-12-23 NOTE — ED Provider Notes (Addendum)
[facility] DEPT Provider Note   CSN: [id number] Arrival date & time: 12/23/18  1125     History   Chief Complaint Chief Complaint  Patient presents with  . Shortness of Breath    HPI Joseph Hess is a 53 y.o. male.     HPI  53 year old male comes in a chief complaint of shortness of breath. Patient has history of pancreatitis.  He reports that whilst at work today he started having shortness of breath.  He started having some chest discomfort with shortness of breath yesterday.  His BP was elevated when he checked also. He has no history of lung disease, coronary artery disease.  Patient also denies any new cough or wheezing.  Chest pain is described as tightness which is not exertional or worse with deep inspiration.  Pt has no hx of PE, DVT and denies any exogenous hormone (testosterone / estrogen) use, long distance travels or surgery in the past 6 weeks, active cancer, recent immobilization.  Patient smokes about half a pack a day.  He denies any premature CAD or sudden unexpected deaths in the family due to cardiac disease.  Past Medical History:  Diagnosis Date  . Cavernous hemangioma of liver   . Chronic pancreatitis ([place])   . Hx of adenomatous colonic polyps 03/30/2015  . Pancreatic insufficiency     Patient Active Problem List   Diagnosis Date Noted  . Hx of adenomatous colonic polyps 03/30/2015  . Other chronic pancreatitis ([facility]) 01/19/2015  . Liver lesion 01/19/2015  . Hepatic lesion 12/27/2014  . Pancreatitis, acute 12/17/2014    Past Surgical History:  Procedure Laterality Date  . WISDOM TOOTH EXTRACTION          Home Medications    Prior to Admission medications   Medication Sig Start Date End Date Taking? Authorizing Provider  naproxen sodium (ALEVE) 220 MG tablet Take 440 mg by mouth daily as needed (pain).   Yes [provider]  cyclobenzaprine (FLEXERIL) 10 MG tablet Take 1 tablet (10 mg total) by  mouth 3 (three) times daily as needed for muscle spasms. Patient not taking: Reported on 03/23/2015 12/30/14   Shawnee Knapp, MD  HYDROcodone-acetaminophen (NORCO/VICODIN) 5-325 MG per tablet Take 1-2 tablets by mouth every 6 (six) hours as needed for moderate pain. Patient not taking: Reported on 03/23/2015 12/30/14   Shawnee Knapp, MD  Pancrelipase, Lip-Prot-Amyl, (ZENPEP) 40000 UNITS CPEP 2 capsules with meals and 1 with snacks Patient not taking: Reported on 12/23/2018 03/23/15   Gatha Mayer, MD    Family History Family History  Problem Relation Age of Onset  . Hypertension Mother   . Diabetes Maternal Grandmother     Social History Social History   Tobacco Use  . Smoking status: Current Every Day Smoker    Types: Cigarettes  . Smokeless tobacco: Never Used  . Tobacco comment: Info given   Substance Use Topics  . Alcohol use: No    Alcohol/week: 0.0 standard drinks    Comment: pt denies use in 2-3 months (11-29-11)  . Drug use: Yes    Types: Marijuana    Comment: weekends     Allergies   Penicillins   Review of Systems Review of Systems  Constitutional: Positive for activity change.  Respiratory: Positive for chest tightness and shortness of breath.   Cardiovascular: Negative for chest pain.  Gastrointestinal: Negative for nausea and vomiting.  Allergic/Immunologic: Negative for immunocompromised state.  All other systems reviewed and  are negative.    Physical Exam Updated Vital Signs BP (!) 162/101 (BP Location: Left Arm)   Pulse 74   Temp 98.1 F (36.7 C) (Oral)   Resp 16   Ht 5\' 11"  (1.803 m)   Wt 63.5 kg   SpO2 100%   BMI 19.53 kg/m   Physical Exam Vitals signs and nursing note reviewed.  Constitutional:      Appearance: He is well-developed.  HENT:     Head: Atraumatic.  Neck:     Musculoskeletal: Neck supple.  Cardiovascular:     Rate and Rhythm: Normal rate.  Pulmonary:     Effort: Pulmonary effort is normal.     Breath sounds: No decreased  breath sounds or wheezing.  Musculoskeletal:     Right lower leg: No edema.     Left lower leg: No edema.  Skin:    General: Skin is warm.  Neurological:     Mental Status: He is alert and oriented to person, place, and time.      ED Treatments / Results  Labs (all labs ordered are listed, but only abnormal results are displayed) Labs Reviewed  COMPREHENSIVE METABOLIC PANEL - Abnormal; Notable for the following components:      Result Value   Potassium 3.4 (*)    Glucose, Bld 147 (*)    All other components within normal limits  CBC WITH DIFFERENTIAL/PLATELET  BRAIN NATRIURETIC PEPTIDE  PROTIME-INR    EKG EKG Interpretation  Date/Time:  Tuesday December 23 2018 11:55:29 EDT Ventricular Rate:  60 PR Interval:    QRS Duration: 85 QT Interval:  423 QTC Calculation: 423 R Axis:   76 Text Interpretation:  Sinus rhythm RSR' in V1 or V2, probably normal variant No acute changes No significant change since last tracing Confirmed by Varney Biles [phone]) on 12/23/2018 2:37:45 PM    Date: 12/23/2018  Rate: 62  Rhythm: normal sinus rhythm  QRS Axis: normal  Intervals: normal  ST/T Wave abnormalities: normal  Conduction Disutrbances: none  Narrative Interpretation: unremarkable    Radiology Dg Chest 2 View  Result Date: 12/23/2018 CLINICAL DATA:  Chest pain, shortness of breath EXAM: CHEST - 2 VIEW COMPARISON:  12/17/2014 FINDINGS: The heart size and mediastinal contours are within normal limits. Both lungs are clear. The visualized skeletal structures are unremarkable. IMPRESSION: No active cardiopulmonary disease. Electronically Signed   By: Davina Poke M.D.   On: 12/23/2018 11:59    Procedures Procedures (including critical care time)  Medications Ordered in ED Medications  albuterol (VENTOLIN HFA) 108 (90 Base) MCG/ACT inhaler 4 puff (4 puffs Inhalation Given 12/23/18 1505)     Initial Impression / Assessment and Plan / ED Course  I have reviewed the triage  vital signs and the nursing notes.  Pertinent labs & imaging results that were available during my care of the patient were reviewed by me and considered in my medical decision making (see chart for details).        53 year old male comes in a chief complaint of chest pain and shortness of breath. He does not have any cough, wheezing.  The pain is not exertional, it is described as tightness that is not radiating -and is not consistent with typical ACS pain.   He was given breathing treatments which helped his shortness of breath.  He is reporting that he is having exertional shortness of breath, and walking about 100 m gets him short of breath now.  There is no hypoxia.  Patient  has no PE risk factors.  EKG is reassuring.  BNP does not show any evidence of CHF, and as a surrogate, this is unlikely to be a large PE then.  Troponins not ordered as the EKG is normal and the pain does not appear to be related to MI.  Repeat EKG is unchanged.  Patient has been advised to follow-up with the PCP.  He is insured although he does not have a PCP.  I advised him to contact his insurance company to get a new PCP.  We will send him home with his inhaler.  The patient appears reasonably screened and/or stabilized for discharge and I doubt any other medical condition or other [facility] requiring further screening, evaluation, or treatment in the ED at this time prior to discharge.   Results from the ER workup discussed with the patient face to face and all questions answered to the best of my ability. The patient is safe for discharge with strict return precautions.  Final Clinical Impressions(s) / ED Diagnoses   Final diagnoses:  SOB (shortness of breath)  Dyspnea, unspecified type    ED Discharge Orders    None          Varney Biles, MD 12/23/18 [place]

## 2018-12-23 NOTE — ED Notes (Signed)
Pt reports intermittent SOB and intermittent chest pain.  Sts pain started "the other day while at work."  NAD noted.  Pt easily speaking in full sentences.

## 2018-12-23 NOTE — Discharge Instructions (Addendum)
We saw you in the ER for shortness of breath.  All the results in the ER are normal, labs and imaging. We are not sure what is causing your symptoms. The workup in the ER is not complete, and is limited to screening for life threatening and emergent conditions only, so please see a primary care doctor for further evaluation.

## 2018-12-23 NOTE — ED Notes (Signed)
Unable to do EKG at this time patient went to xray

## 2018-12-23 NOTE — ED Triage Notes (Signed)
Pt states yesterday he was having CP and SHOB. Pt states that his BP was elevated as well. Denies any symptoms today.

## 2019-01-04 MED ORDER — SODIUM CHLORIDE (PF) 0.9 % IJ SOLN
INTRAMUSCULAR | Status: AC
  Filled 2019-01-04: qty 50

## 2019-12-15 ENCOUNTER — Other Ambulatory Visit: Payer: Self-pay | Admitting: Family Medicine

## 2019-12-15 DIAGNOSIS — S9032XD Contusion of left foot, subsequent encounter: Secondary | ICD-10-CM

## 2019-12-17 ENCOUNTER — Other Ambulatory Visit: Payer: Self-pay | Admitting: Family Medicine

## 2019-12-21 ENCOUNTER — Other Ambulatory Visit: Payer: Self-pay

## 2019-12-21 ENCOUNTER — Ambulatory Visit
Admission: RE | Admit: 2019-12-21 | Discharge: 2019-12-21 | Disposition: A | Discharge status: Home / Self Care | Payer: BC Managed Care – PPO | Source: Ambulatory Visit | Attending: Family Medicine | Admitting: Family Medicine

## 2019-12-21 DIAGNOSIS — S9032XD Contusion of left foot, subsequent encounter: Secondary | ICD-10-CM

## 2020-10-14 NOTE — Progress Notes (Signed)
 [facility] : Video Visit - Return Visit  MEDICAL MANAGEMENT OF TOBACCO DEPENDENCE   Smoking Cessation Program Related Data Program Visit Type: Video-Based Medical Management - Return Visit  Referring MD: Self  HPI Joseph Hess is a 55 y.o. male who presents for a video visit with the Smoking Cessation Program for continued evaluation and treatment of tobacco use. Joseph Hess has a history of Hypertension, likely caused in part by smoking or impacted by smoking.  Also Alcohol use disorder, Substance use disorder, which makes it more difficult to quit smoking.  His stage of change is Preparation - should be ready to set a quit date soon.  Joseph Hess is currently smoking 4 cigarettes per day and has had difficulty quitting. He returned for his follow up today, reports that he down to 4 cpd. This is mostly due to his busy work schedule. He is currently working 2 jobs and states that he barely has time to smoke between working and sleeping. He has not been able to pick up any of his quit medications, they were not available at the pharmacy. He plans to see if he can get them this weekend. He was encouraged to set a goal for cutting back every week to help with his quitting process. He states that he feel he can quit and is still motivated.    PMHx Past Medical History:  Diagnosis Date  . Alcohol-induced chronic pancreatitis (CMS-HCC)   . Hypertension      ROS CV: Denies chest palpitations. GI: Denies nausea or vomiting. Neurologic: Denies seizures history.  Psychiatric: Denies vivid dreams, insomnia, irritation, agitation, anxiety, depression or suicidal ideation Skin: Denies rash  Meds at start of visit Current Outpatient Medications  Medication Sig Dispense Refill  . losartan  (COZAAR ) 50 MG tablet Take by mouth       . naproxen sodium (ALEVE) 220 MG tablet Take by mouth (Patient not taking: Reported on 07/22/2020  )    . nicotine (NICODERM CQ) 21 mg/24 hr patch Apply  one patch daily x 12 weeks. 84 patch 1  . nicotine polacrilex (NICORETTE) 2 mg gum Chew gum a few times then place between cheek and gum. Use every 30 minutes as needed for smoking urges 80 each 8  . pancrelipase  (CREON ) 24,000-76,000-120,000 unit DR capsule Take 3 capsules by mouth 3 (three) times daily with meals 180 capsule 3  . PROAIR  HFA 90 mcg/actuation inhaler       . varenicline (CHANTIX) 1 mg tablet Take 1 tablet (1 mg total) by mouth 2 (two) times daily Take 1 tablet twice daily with food. 60 tablet 4   No current facility-administered medications for this visit.     Objective:   Exam General: No acute distress, alert and interactive with the provider. Respiratory: No noted coughing or wheezing. No respiratory distress noted. Psychiatric: Pleasant; answers questions appropriately, mood and affect appear normal.  Review Objective Patient Data  Recent Smoking History: In the last 30 days, have you smoked at all, even a puff?: Yes In the last 7 days, have you smoked at all, even a puff?: Yes  Medical Problems Impacted by Smoking Hypertension  Psychological Challenges to Quitting Alcohol use disorder, Substance use disorder  Smoking Urges: (2 (0-9) (0-3 = mild, 4-6 = moderate, 7-9 = severe withdrawal)   Motivation + Confidence:  11 (2-14) 2-5 = Low, 6-10 = Moderate,11-14 = High)     Assessment/ Plan  Behavioral Intervention: Workbook/Tear Sheet (no behavioral provider) Quit Smoking  Prescriptions: Chantix, Nicotine Gum, Nicotine Patch Qualifies for Lung Cancer Screening: Yes   Medical Management of Tobacco Dependence (Clinical Assessment, Medication Management, Coordination of Care):  Based on an assessment of his history, my plan is to use Chantix, Nicotine Gum, Nicotine Patch and provide Workbook/Tear Sheet (no behavioral provider) for behavioral treatment.  Joseph Hess and I discussed this plan in detail including a description of possible side effects. he can call if he  encounters side effects or other problems. He said that he wished to proceed. During the visit I provided medical evaluation and management of the patient on reduce smoking in preparation for a quit attempt.  I also spent time coordinating care for the patient including communication with the medical team on findings, diagnoses, and treatment plan and make referral to lung cancer screening.     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 PMHx of HTN and liver disease. Complicated by frequent substance and alcohol use.    Plan:  1. Start on 21 mg nicotine patches 2. Adding 2 mg nicotine gum for urges 3. Adding  Varenicline 1 mg daily with food.  4. Referral sent for LCS.  5. Discussed setting intentions to purposely cut back and not by passive action. 6. Follow up in 3 weeks to continue SCT   Essential hypertension Hypertension: Joseph Hess has a history of hypertension.  Studies have shown that smoking increases systolic blood pressure by an average of 7 points. Because Joseph Hess has hypertension, smoking cessation is now a component of his treatment for hypertension.  As he undergoes treatment for smoking cessation, I will follow his blood pressure to assess change.       This video encounter was conducted with the patient's (or proxy's) verbal consent via secure, interactive audio and video telecommunications while away from clinic/office/hospital.  The patient (or proxy) was instructed to have this encounter in a suitably private space and to only have persons present to whom they give permission to participate. In addition, patient identity was confirmed by use of name plus an additional identifier.  2020-02-10 E&M) This visit was coded based on time. I spent a total of 30 minutes in both face-to-face and non-face-to-face activities for this visit on the date of this  encounter.        Attestation:   I personally performed the service, non-incident to. (WP)   MOBOLUWADE MELVINIA CONSTANT, PA  Future Appointments Future Appointments    Date/Time Provider Department Center Visit Type   11/04/2020 12:30 PM (Arrive by 12:15 PM) Abe-Lathan, Moboluwade, PA [facility] VIDEO VISIT RETURN   12/27/2020 2:30 PM (Arrive by 2:00 PM) CC MR 1 [facility] MRI ABD [facility]
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
## 2024-01-02 ENCOUNTER — Inpatient Hospital Stay (HOSPITAL_COMMUNITY): Payer: Self-pay

## 2024-01-02 ENCOUNTER — Other Ambulatory Visit (HOSPITAL_COMMUNITY): Payer: Self-pay

## 2024-01-02 ENCOUNTER — Emergency Department (HOSPITAL_COMMUNITY): Payer: Self-pay

## 2024-01-02 ENCOUNTER — Inpatient Hospital Stay (HOSPITAL_COMMUNITY)
Admission: EM | Admit: 2024-01-02 | Discharge: 2024-01-05 | DRG: 321 | Disposition: A | Discharge status: Home / Self Care | Payer: Self-pay | Attending: Cardiology | Admitting: Cardiology

## 2024-01-02 ENCOUNTER — Encounter (HOSPITAL_COMMUNITY): Payer: Self-pay | Admitting: Emergency Medicine

## 2024-01-02 ENCOUNTER — Other Ambulatory Visit: Payer: Self-pay

## 2024-01-02 ENCOUNTER — Telehealth (HOSPITAL_COMMUNITY): Payer: Self-pay | Admitting: Pharmacy Technician

## 2024-01-02 ENCOUNTER — Encounter (HOSPITAL_COMMUNITY)
Admission: EM | Disposition: A | Discharge status: Home / Self Care | Payer: Self-pay | Source: Home / Self Care | Attending: Cardiology

## 2024-01-02 DIAGNOSIS — Z7982 Long term (current) use of aspirin: Secondary | ICD-10-CM

## 2024-01-02 DIAGNOSIS — I429 Cardiomyopathy, unspecified: Secondary | ICD-10-CM | POA: Diagnosis present

## 2024-01-02 DIAGNOSIS — Z79899 Other long term (current) drug therapy: Secondary | ICD-10-CM | POA: Diagnosis not present

## 2024-01-02 DIAGNOSIS — I213 ST elevation (STEMI) myocardial infarction of unspecified site: Secondary | ICD-10-CM

## 2024-01-02 DIAGNOSIS — Z880 Allergy status to penicillin: Secondary | ICD-10-CM | POA: Diagnosis not present

## 2024-01-02 DIAGNOSIS — I5021 Acute systolic (congestive) heart failure: Secondary | ICD-10-CM | POA: Diagnosis present

## 2024-01-02 DIAGNOSIS — F1090 Alcohol use, unspecified, uncomplicated: Secondary | ICD-10-CM | POA: Diagnosis present

## 2024-01-02 DIAGNOSIS — F17210 Nicotine dependence, cigarettes, uncomplicated: Secondary | ICD-10-CM | POA: Diagnosis present

## 2024-01-02 DIAGNOSIS — R072 Precordial pain: Secondary | ICD-10-CM | POA: Diagnosis present

## 2024-01-02 DIAGNOSIS — I2510 Atherosclerotic heart disease of native coronary artery without angina pectoris: Secondary | ICD-10-CM | POA: Diagnosis present

## 2024-01-02 DIAGNOSIS — F1990 Other psychoactive substance use, unspecified, uncomplicated: Secondary | ICD-10-CM | POA: Insufficient documentation

## 2024-01-02 DIAGNOSIS — I110 Hypertensive heart disease with heart failure: Secondary | ICD-10-CM | POA: Diagnosis present

## 2024-01-02 DIAGNOSIS — E119 Type 2 diabetes mellitus without complications: Secondary | ICD-10-CM | POA: Diagnosis present

## 2024-01-02 DIAGNOSIS — K861 Other chronic pancreatitis: Secondary | ICD-10-CM | POA: Diagnosis present

## 2024-01-02 DIAGNOSIS — F1290 Cannabis use, unspecified, uncomplicated: Secondary | ICD-10-CM | POA: Diagnosis present

## 2024-01-02 DIAGNOSIS — R079 Chest pain, unspecified: Secondary | ICD-10-CM

## 2024-01-02 DIAGNOSIS — Z5971 Insufficient health insurance coverage: Secondary | ICD-10-CM

## 2024-01-02 DIAGNOSIS — Z860101 Personal history of adenomatous and serrated colon polyps: Secondary | ICD-10-CM

## 2024-01-02 DIAGNOSIS — E872 Acidosis, unspecified: Secondary | ICD-10-CM | POA: Diagnosis present

## 2024-01-02 DIAGNOSIS — I2111 ST elevation (STEMI) myocardial infarction involving right coronary artery: Secondary | ICD-10-CM | POA: Diagnosis present

## 2024-01-02 DIAGNOSIS — Z833 Family history of diabetes mellitus: Secondary | ICD-10-CM

## 2024-01-02 DIAGNOSIS — Z8249 Family history of ischemic heart disease and other diseases of the circulatory system: Secondary | ICD-10-CM

## 2024-01-02 DIAGNOSIS — I10 Essential (primary) hypertension: Secondary | ICD-10-CM | POA: Insufficient documentation

## 2024-01-02 DIAGNOSIS — Z7984 Long term (current) use of oral hypoglycemic drugs: Secondary | ICD-10-CM | POA: Diagnosis not present

## 2024-01-02 DIAGNOSIS — Z955 Presence of coronary angioplasty implant and graft: Secondary | ICD-10-CM

## 2024-01-02 DIAGNOSIS — I426 Alcoholic cardiomyopathy: Secondary | ICD-10-CM | POA: Diagnosis present

## 2024-01-02 HISTORY — PX: CORONARY/GRAFT ACUTE MI REVASCULARIZATION: CATH118305

## 2024-01-02 LAB — BASIC METABOLIC PANEL WITH GFR
Anion gap: 10 (ref 5–15)
BUN: 6 mg/dL (ref 6–20)
CO2: 20 mmol/L — ABNORMAL LOW (ref 22–32)
Calcium: 8.6 mg/dL — ABNORMAL LOW (ref 8.9–10.3)
Chloride: 108 mmol/L (ref 98–111)
Creatinine, Ser: 0.76 mg/dL (ref 0.61–1.24)
GFR, Estimated: >60 mL/min (ref >60)
Glucose, Bld: 118 mg/dL — ABNORMAL HIGH (ref 70–99)
Potassium: 3.6 mmol/L (ref 3.5–5.1)
Sodium: 138 mmol/L (ref 135–145)

## 2024-01-02 LAB — POCT I-STAT, CHEM 8
BUN: 7 mg/dL (ref 6–20)
Calcium, Ion: 1.21 mmol/L (ref 1.15–1.40)
Chloride: 101 mmol/L (ref 98–111)
Creatinine, Ser: 0.8 mg/dL (ref 0.61–1.24)
Glucose, Bld: 153 mg/dL — ABNORMAL HIGH (ref 70–99)
HCT: 38 % — ABNORMAL LOW (ref 39.0–52.0)
Hemoglobin: 12.9 g/dL — ABNORMAL LOW (ref 13.0–17.0)
Potassium: 2.9 mmol/L — ABNORMAL LOW (ref 3.5–5.1)
Sodium: 137 mmol/L (ref 135–145)
TCO2: 20 mmol/L — ABNORMAL LOW (ref 22–32)

## 2024-01-02 LAB — ECHOCARDIOGRAM COMPLETE
Area-P 1/2: 3.03 cm2
Calc EF: 33.4 %
Height: 71 in
S' Lateral: 4.1 cm
Single Plane A2C EF: 29.7 %
Single Plane A4C EF: 37.5 %
Weight: 2239.87 [oz_av]

## 2024-01-02 LAB — COMPREHENSIVE METABOLIC PANEL WITH GFR
ALT: 33 U/L (ref 0–44)
AST: 125 U/L — ABNORMAL HIGH (ref 15–41)
Albumin: 3.4 g/dL — ABNORMAL LOW (ref 3.5–5.0)
Alkaline Phosphatase: 76 U/L (ref 38–126)
Anion gap: 8 (ref 5–15)
BUN: 9 mg/dL (ref 6–20)
CO2: 22 mmol/L (ref 22–32)
Calcium: 8.6 mg/dL — ABNORMAL LOW (ref 8.9–10.3)
Chloride: 105 mmol/L (ref 98–111)
Creatinine, Ser: 0.9 mg/dL (ref 0.61–1.24)
GFR, Estimated: >60 mL/min (ref >60)
Glucose, Bld: 138 mg/dL — ABNORMAL HIGH (ref 70–99)
Potassium: 3 mmol/L — ABNORMAL LOW (ref 3.5–5.1)
Sodium: 135 mmol/L (ref 135–145)
Total Bilirubin: 0.4 mg/dL (ref 0.0–1.2)
Total Protein: 6.7 g/dL (ref 6.5–8.1)

## 2024-01-02 LAB — CBC WITH DIFFERENTIAL/PLATELET
Abs Immature Granulocytes: 0.02 K/uL (ref 0.00–0.07)
Basophils Absolute: 0.1 K/uL (ref 0.0–0.1)
Basophils Relative: 1 %
Eosinophils Absolute: 0 K/uL (ref 0.0–0.5)
Eosinophils Relative: 0 %
HCT: 39.9 % (ref 39.0–52.0)
Hemoglobin: 12.8 g/dL — ABNORMAL LOW (ref 13.0–17.0)
Immature Granulocytes: 0 %
Lymphocytes Relative: 19 %
Lymphs Abs: 1.7 K/uL (ref 0.7–4.0)
MCH: 26.9 pg (ref 26.0–34.0)
MCHC: 32.1 g/dL (ref 30.0–36.0)
MCV: 84 fL (ref 80.0–100.0)
Monocytes Absolute: 0.6 K/uL (ref 0.1–1.0)
Monocytes Relative: 7 %
Neutro Abs: 6.4 K/uL (ref 1.7–7.7)
Neutrophils Relative %: 73 %
Platelets: 293 K/uL (ref 150–400)
RBC: 4.75 MIL/uL (ref 4.22–5.81)
RDW: 14.6 % (ref 11.5–15.5)
WBC: 8.8 K/uL (ref 4.0–10.5)
nRBC: 0 % (ref 0.0–0.2)

## 2024-01-02 LAB — LIPID PANEL
Cholesterol: 175 mg/dL (ref 0–200)
HDL: 78 mg/dL (ref >40)
LDL Cholesterol: 85 mg/dL (ref 0–99)
Total CHOL/HDL Ratio: 2.2 ratio
Triglycerides: 60 mg/dL (ref <150)
VLDL: 12 mg/dL (ref 0–40)

## 2024-01-02 LAB — MAGNESIUM: Magnesium: 1.8 mg/dL (ref 1.7–2.4)

## 2024-01-02 LAB — I-STAT CG4 LACTIC ACID, ED: Lactic Acid, Venous: 2.9 mmol/L (ref 0.5–1.9)

## 2024-01-02 LAB — TROPONIN I (HIGH SENSITIVITY)
Troponin I (High Sensitivity): 11580 ng/L (ref <18)
Troponin I (High Sensitivity): >24000 ng/L (ref <18)

## 2024-01-02 LAB — APTT: aPTT: 25 s (ref 24–36)

## 2024-01-02 LAB — POCT ACTIVATED CLOTTING TIME
Activated Clotting Time: 239 s
Activated Clotting Time: 285 s

## 2024-01-02 LAB — PROTIME-INR
INR: 1 (ref 0.8–1.2)
Prothrombin Time: 13.5 s (ref 11.4–15.2)

## 2024-01-02 LAB — HEMOGLOBIN A1C
Hgb A1c MFr Bld: 6.9 % — ABNORMAL HIGH (ref 4.8–5.6)
Mean Plasma Glucose: 151.33 mg/dL

## 2024-01-02 LAB — HIV ANTIBODY (ROUTINE TESTING W REFLEX): HIV Screen 4th Generation wRfx: NONREACTIVE

## 2024-01-02 LAB — CG4 I-STAT (LACTIC ACID): Lactic Acid, Venous: 1.6 mmol/L (ref 0.5–1.9)

## 2024-01-02 LAB — TSH: TSH: 2.208 u[IU]/mL (ref 0.350–4.500)

## 2024-01-02 LAB — LACTIC ACID, PLASMA: Lactic Acid, Venous: 1.2 mmol/L (ref 0.5–1.9)

## 2024-01-02 SURGERY — CORONARY/GRAFT ACUTE MI REVASCULARIZATION
Anesthesia: LOCAL

## 2024-01-02 MED ORDER — SODIUM CHLORIDE 0.9 % IV SOLN
INTRAVENOUS | Status: AC | PRN
  Administered 2024-01-02: 4 ug/kg/min via INTRAVENOUS

## 2024-01-02 MED ORDER — FENTANYL CITRATE (PF) 100 MCG/2ML IJ SOLN
INTRAMUSCULAR | Status: AC
  Filled 2024-01-02: qty 2

## 2024-01-02 MED ORDER — SODIUM CHLORIDE 0.9% FLUSH: 3.0000 mL | INTRAVENOUS | Status: DC | PRN

## 2024-01-02 MED ORDER — ACETAMINOPHEN 325 MG PO TABS
650.0000 mg | ORAL_TABLET | ORAL | Status: DC | PRN
  Administered 2024-01-02: 650 mg via ORAL
  Filled 2024-01-02: qty 2

## 2024-01-02 MED ORDER — SODIUM CHLORIDE 0.9% FLUSH
3.0000 mL | Freq: Two times a day (BID) | INTRAVENOUS | Status: DC
  Administered 2024-01-02 – 2024-01-05 (×6): 3 mL via INTRAVENOUS

## 2024-01-02 MED ORDER — HEPARIN (PORCINE) IN NACL 1000-0.9 UT/500ML-% IV SOLN
INTRAVENOUS | Status: DC | PRN
  Administered 2024-01-02 (×2): 500 mL

## 2024-01-02 MED ORDER — ASPIRIN 81 MG PO TBEC
81.0000 mg | DELAYED_RELEASE_TABLET | Freq: Every day | ORAL | Status: DC
  Administered 2024-01-03 – 2024-01-05 (×3): 81 mg via ORAL
  Filled 2024-01-02 (×3): qty 1

## 2024-01-02 MED ORDER — PRASUGREL HCL 10 MG PO TABS
10.0000 mg | ORAL_TABLET | Freq: Every day | ORAL | Status: DC
  Administered 2024-01-03 – 2024-01-05 (×3): 10 mg via ORAL
  Filled 2024-01-02 (×3): qty 1

## 2024-01-02 MED ORDER — PRASUGREL HCL 10 MG PO TABS
ORAL_TABLET | ORAL | Status: DC | PRN
  Administered 2024-01-02: 60 mg via ORAL

## 2024-01-02 MED ORDER — CHLORHEXIDINE GLUCONATE CLOTH 2 % EX PADS
6.0000 | MEDICATED_PAD | Freq: Every day | CUTANEOUS | Status: DC
  Administered 2024-01-02 – 2024-01-05 (×4): 6 via TOPICAL

## 2024-01-02 MED ORDER — HEPARIN SODIUM (PORCINE) 1000 UNIT/ML IJ SOLN
INTRAMUSCULAR | Status: DC | PRN
  Administered 2024-01-02: 3000 [IU] via INTRAVENOUS
  Administered 2024-01-02: 5000 [IU] via INTRAVENOUS

## 2024-01-02 MED ORDER — SODIUM CHLORIDE 0.9 % IV SOLN: 250.0000 mL | INTRAVENOUS | Status: AC | PRN

## 2024-01-02 MED ORDER — FENTANYL CITRATE (PF) 100 MCG/2ML IJ SOLN
INTRAMUSCULAR | Status: DC | PRN
  Administered 2024-01-02: 25 ug via INTRAVENOUS

## 2024-01-02 MED ORDER — ENSURE PLUS HIGH PROTEIN PO LIQD
237.0000 mL | Freq: Two times a day (BID) | ORAL | Status: DC
  Administered 2024-01-03 – 2024-01-05 (×5): 237 mL via ORAL

## 2024-01-02 MED ORDER — SODIUM CHLORIDE 0.9 % IV SOLN
INTRAVENOUS | Status: AC
  Administered 2024-01-02: 10 mL/h via INTRAVENOUS

## 2024-01-02 MED ORDER — NITROGLYCERIN 0.4 MG SL SUBL: 0.4000 mg | SUBLINGUAL_TABLET | SUBLINGUAL | Status: DC | PRN

## 2024-01-02 MED ORDER — CANGRELOR TETRASODIUM 50 MG IV SOLR
INTRAVENOUS | Status: AC
  Filled 2024-01-02: qty 50

## 2024-01-02 MED ORDER — LIDOCAINE HCL (PF) 1 % IJ SOLN
INTRAMUSCULAR | Status: DC | PRN
  Administered 2024-01-02: 2 mL via INTRADERMAL

## 2024-01-02 MED ORDER — MAGNESIUM SULFATE 2 GM/50ML IV SOLN
2.0000 g | Freq: Once | INTRAVENOUS | Status: AC
  Administered 2024-01-02: 2 g via INTRAVENOUS
  Filled 2024-01-02: qty 50

## 2024-01-02 MED ORDER — FREE WATER
500.0000 mL | Freq: Once | Status: AC
  Administered 2024-01-02: 500 mL via ORAL

## 2024-01-02 MED ORDER — VERAPAMIL HCL 2.5 MG/ML IV SOLN
INTRAVENOUS | Status: DC | PRN
  Administered 2024-01-02: 10 mL via INTRA_ARTERIAL

## 2024-01-02 MED ORDER — MIDAZOLAM HCL 2 MG/2ML IJ SOLN
INTRAMUSCULAR | Status: DC | PRN
  Administered 2024-01-02: 1 mg via INTRAVENOUS

## 2024-01-02 MED ORDER — HEPARIN SODIUM (PORCINE) 1000 UNIT/ML IJ SOLN
INTRAMUSCULAR | Status: AC
  Filled 2024-01-02: qty 10

## 2024-01-02 MED ORDER — ONDANSETRON HCL 4 MG/2ML IJ SOLN: 4.0000 mg | Freq: Four times a day (QID) | INTRAMUSCULAR | Status: DC | PRN

## 2024-01-02 MED ORDER — VERAPAMIL HCL 2.5 MG/ML IV SOLN
INTRAVENOUS | Status: DC | PRN
  Administered 2024-01-02 (×4): 200 ug via INTRACORONARY

## 2024-01-02 MED ORDER — SPIRONOLACTONE 25 MG PO TABS
25.0000 mg | ORAL_TABLET | Freq: Every day | ORAL | Status: DC
  Administered 2024-01-02 – 2024-01-05 (×4): 25 mg via ORAL
  Filled 2024-01-02 (×4): qty 1

## 2024-01-02 MED ORDER — MIDAZOLAM HCL 2 MG/2ML IJ SOLN
INTRAMUSCULAR | Status: AC
  Filled 2024-01-02: qty 2

## 2024-01-02 MED ORDER — LIDOCAINE HCL (PF) 1 % IJ SOLN
INTRAMUSCULAR | Status: AC
Start: 2024-01-02 — End: 2024-01-02
  Filled 2024-01-02: qty 30

## 2024-01-02 MED ORDER — HEPARIN SODIUM (PORCINE) 5000 UNIT/ML IJ SOLN
3800.0000 [IU] | Freq: Once | INTRAMUSCULAR | Status: AC
  Administered 2024-01-02: 3800 [IU] via INTRAVENOUS
  Filled 2024-01-02: qty 1

## 2024-01-02 MED ORDER — POTASSIUM CHLORIDE CRYS ER 20 MEQ PO TBCR
40.0000 meq | EXTENDED_RELEASE_TABLET | ORAL | Status: AC
  Administered 2024-01-02 (×2): 40 meq via ORAL
  Filled 2024-01-02 (×2): qty 2

## 2024-01-02 MED ORDER — VERAPAMIL HCL 2.5 MG/ML IV SOLN
INTRAVENOUS | Status: AC
  Filled 2024-01-02: qty 2

## 2024-01-02 MED ORDER — STERILE WATER FOR INJECTION IJ SOLN
INTRAMUSCULAR | Status: AC
Start: 2024-01-02 — End: 2024-01-02
  Filled 2024-01-02: qty 10

## 2024-01-02 MED ORDER — CARVEDILOL 3.125 MG PO TABS
3.1250 mg | ORAL_TABLET | Freq: Two times a day (BID) | ORAL | Status: DC
  Administered 2024-01-02 – 2024-01-05 (×7): 3.125 mg via ORAL
  Filled 2024-01-02 (×7): qty 1

## 2024-01-02 MED ORDER — ORAL CARE MOUTH RINSE: 15.0000 mL | OROMUCOSAL | Status: DC | PRN

## 2024-01-02 MED ORDER — IOHEXOL 350 MG/ML SOLN
INTRAVENOUS | Status: DC | PRN
  Administered 2024-01-02: 110 mL

## 2024-01-02 MED ORDER — CANGRELOR BOLUS VIA INFUSION
INTRAVENOUS | Status: DC | PRN
  Administered 2024-01-02: 1905 ug via INTRAVENOUS

## 2024-01-02 MED ORDER — PRASUGREL HCL 10 MG PO TABS
ORAL_TABLET | ORAL | Status: AC
  Filled 2024-01-02: qty 6

## 2024-01-02 MED ORDER — ATORVASTATIN CALCIUM 80 MG PO TABS
80.0000 mg | ORAL_TABLET | Freq: Every day | ORAL | Status: DC
  Administered 2024-01-02 – 2024-01-05 (×4): 80 mg via ORAL
  Filled 2024-01-02 (×4): qty 1

## 2024-01-02 SURGICAL SUPPLY — 14 items
BALLOON EMERGE MR 2.5X15 (BALLOONS) IMPLANT
BALLOON ~~LOC~~ EMERGE MR 3.25X12 (BALLOONS) IMPLANT
CATH INFINITI 5 FR JL3.5 (CATHETERS) IMPLANT
CATH INFINITI AMBI 5FR TG (CATHETERS) IMPLANT
CATH LAUNCHER 6FR JR4 (CATHETERS) IMPLANT
DEVICE RAD COMP TR BAND LRG (VASCULAR PRODUCTS) IMPLANT
GLIDESHEATH SLEND A-KIT 6F 22G (SHEATH) IMPLANT
GUIDEWIRE INQWIRE 1.5J.035X260 (WIRE) IMPLANT
KIT ENCORE 26 ADVANTAGE (KITS) IMPLANT
KIT HEMO VALVE WATCHDOG (MISCELLANEOUS) IMPLANT
PACK CARDIAC CATHETERIZATION (CUSTOM PROCEDURE TRAY) ×1 IMPLANT
SET ATX-X65L (MISCELLANEOUS) IMPLANT
STENT SYNERGY XD 3.0X20 (Permanent Stent) IMPLANT
WIRE RUNTHROUGH .014X180CM (WIRE) IMPLANT

## 2024-01-02 NOTE — H&P (Signed)
 Cardiology Admission History and Physical:   Patient ID: Joseph Hess MRN: [id number]; DOB: 1965/06/08   Admission date: 01/02/2024  Primary Care Provider: Loreli Elyn SAILOR, MD [facility] Cardiologist: None  [facility] Electrophysiologist:  None   Chief Complaint:  CP  Patient Profile:   Joseph Hess is a 59 y.o. male with a substance use (tobacco, etoh, marijuana) who presents with CP and found to have inferior STEMI.   History of Present Illness:   Joseph Hess started to experience chest discomfort 08/13 which he thought was indigestion however the symptoms became more progressive and frequent.  He had around 4 episodes yesterday that lasted several minutes along a piece and were moderate to severe intensity without radiation.  Overnight his symptoms became worse and the pain more severe.  The pain was 8/10 severity on arrival and located in the left chest without radiation.  He did not have any associated symptoms.  He did note that over the past few weeks he has had mild dyspnea on exertion with activities such as cutting his grass.  He was not limited with activity prior to the past few weeks.  Review of EMS run strips with 1 mm ST elevation in inferior leads and 1 mm ST depressions in V4R. ECG on arrival consistent with the same.   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 PCI with restoration of flow but appeared to be late presenting STEMI.   Past Medical History:  Diagnosis Date   Cavernous hemangioma of liver    Chronic pancreatitis (HCC)    Hx of adenomatous colonic polyps 03/30/2015   Pancreatic insufficiency    Past Surgical History:  Procedure Laterality Date   WISDOM TOOTH EXTRACTION      Medications Prior to Admission: Prior to Admission medications   Medication Sig Start Date End Date Taking? Authorizing Provider  cyclobenzaprine  (FLEXERIL ) 10 MG tablet Take 1 tablet (10 mg total) by mouth 3 (three) times daily as needed for muscle spasms. Patient  not taking: Reported on 03/23/2015 12/30/14   Joseph Elyn SAILOR, MD  HYDROcodone -acetaminophen  (NORCO/VICODIN) 5-325 MG per tablet Take 1-2 tablets by mouth every 6 (six) hours as needed for moderate pain. Patient not taking: Reported on 03/23/2015 12/30/14   Joseph Elyn SAILOR, MD  naproxen sodium (ALEVE) 220 MG tablet Take 440 mg by mouth daily as needed (pain).    [provider]  Pancrelipase , Lip-Prot-Amyl, (ZENPEP ) 40000 UNITS CPEP 2 capsules with meals and 1 with snacks Patient not taking: Reported on 12/23/2018 03/23/15   Joseph Lupita BRAVO, MD    Allergies:    Allergies  Allergen Reactions   Penicillins     Reaction unknown to pt   Social History:   Social History   Socioeconomic History   Marital status: Married    Spouse name: Not on file   Number of children: 2   Years of education: Not on file   Highest education level: Not on file  Occupational History   Occupation: food service  Tobacco Use   Smoking status: Every Day    Types: Cigarettes   Smokeless tobacco: Never   Tobacco comments:    Info given   Substance and Sexual Activity   Alcohol use: No    Alcohol/week: 0.0 standard drinks of alcohol    Comment: pt denies use in 2-3 months (11-29-11)   Drug use: Yes    Types: Marijuana    Comment: weekends   Sexual activity: Not on file  Other Topics  Concern   Not on file  Social History Narrative   Not on file   Social Drivers of Health   Financial Resource Strain: Not on file  Food Insecurity: Not on file  Transportation Needs: Not on file  Physical Activity: Not on file  Stress: Not on file (06/30/2019)  Social Connections: Not on file  Intimate Partner Violence: Low Risk  (09/05/2019)   Received from [facility]   Intimate Partner Violence    Insults You: Not on file    Threatens You: Not on file    Screams at You: Not on file    Physically Hurt: Not on file    Intimate Partner Violence Score: Not on file    Family History:   The patient's family history  includes Diabetes in his maternal grandmother; Hypertension in his mother.    ROS:   Review of Systems: [y] = yes, [ ]  = no      General: Weight gain [ ] ; Weight loss [ ] ; Anorexia [ ] ; Fatigue [ ] ; Fever [ ] ; Chills [ ] ; Weakness [ ]    Cardiac: Chest pain/pressure [y]; Resting SOB [ ] ; Exertional SOB [ ] ; Orthopnea [ ] ; Pedal Edema [ ] ; Palpitations [ ] ; Syncope [ ] ; Presyncope [ ] ; Paroxysmal nocturnal dyspnea [ ]    Pulmonary: Cough [ ] ; Wheezing [ ] ; Hemoptysis [ ] ; Sputum [ ] ; Snoring [ ]    GI: Vomiting [ ] ; Dysphagia [ ] ; Melena [ ] ; Hematochezia [ ] ; Heartburn [ ] ; Abdominal pain [ ] ; Constipation [ ] ; Diarrhea [ ] ; BRBPR [ ]    GU: Hematuria [ ] ; Dysuria [ ] ; Nocturia [ ]  Vascular: Pain in legs with walking [ ] ; Pain in feet with lying flat [ ] ; Non-healing sores [ ] ; Stroke [ ] ; TIA [ ] ; Slurred speech [ ] ;   Neuro: Headaches [ ] ; Vertigo [ ] ; Seizures [ ] ; Paresthesias [ ] ;Blurred vision [ ] ; Diplopia [ ] ; Vision changes [ ]    Ortho/Skin: Arthritis [ ] ; Joint pain [ ] ; Muscle pain [ ] ; Joint swelling [ ] ; Back Pain [ ] ; Rash [ ]    Psych: Depression [ ] ; Anxiety [ ]    Heme: Bleeding problems [ ] ; Clotting disorders [ ] ; Anemia [ ]    Endocrine: Diabetes [ ] ; Thyroid dysfunction [ ]    Physical Exam/Data:   Vitals:   01/02/24 0520 01/02/24 0521 01/02/24 0530 01/02/24 0544  BP:   (!) 133/92   Pulse:   68   Resp: (!) 22 (!) 25 (!) 23   Temp:   98.6 F (37 C)   TempSrc:   Oral   SpO2:    100%  Weight:      Height:       No intake or output data in the 24 hours ending 01/02/24 0612    01/02/2024    5:08 AM 12/23/2018   11:35 AM 03/23/2015   12:50 PM  Last 3 Weights  Weight (lbs) 139 lb 15.9 oz 140 lb 136 lb  Weight (kg) 63.5 kg 63.504 kg 61.689 kg     Body mass index is 19.52 kg/m.  General: stable but generally unwell appearing, moderate pain but conversant  HEENT: normal Vascular: No carotid bruits; FA pulses 2+ bilaterally without bruits  Cardiac:  normal S1, S2; RRR; no  murmur  Lungs:  clear to auscultation bilaterally, no wheezing, rhonchi or rales  Abd: soft, nontender, no hepatomegaly  Ext: no edema Musculoskeletal:  No deformities, BUE and BLE strength  normal and equal Skin: warm and dry  Neuro:  CNs 2-12 intact, no focal abnormalities noted Psych:  Normal affect   EKG:  The ECG (01/02/24, 05:21:11) that was done was personally reviewed and demonstrates NSR 72, PR 125, QRS 88, QT/c 473/518, 1 mm ST elevations in inferior leads with reciprocal depressions anterior, 0.5 mm elevations lateral   Relevant CV Studies: None   Laboratory Data:  Chemistry Recent Labs  Lab 01/02/24 0558  NA 137  K 2.9*  CL 101  GLUCOSE 153*  BUN 7  CREATININE 0.80    Hematology Recent Labs  Lab 01/02/24 0515 01/02/24 0558  WBC 8.8  --   RBC 4.75  --   HGB 12.8* 12.9*  HCT 39.9 38.0*  MCV 84.0  --   MCH 26.9  --   MCHC 32.1  --   RDW 14.6  --   PLT 293  --    The patient's TIMI risk score is 1, which indicates a 1.6% risk of all cause mortality at 30 days.  Assessment and Plan:  Joseph Hess is a 57 y.o. male with a substance use (tobacco, etoh, marijuana) who presents with CP and found to have inferior STEMI.   Late presenting inferior STEMI  S/p PCI of pRCA with restoration of flow and large vessel however diffuse disease and appeared to be late presentation c/w his report. Plan for DAPT with ASA+prasugrel .  - s/p ASA load, continue ASA 81 mg daily  - s/p Prasugrel  load, continue 10 mg daily (69M, 63.5 kg) - TTE ordered - A1c, lipid panel, Lpa, A1c, TSH ordered - coreg  3.125 mg bid ordered  Polysubstance use - will need resources for etoh use (drinks 2 pints/wk) and tobacco cessation (0.5 ppd)   Severity of Illness: The appropriate patient status for this patient is INPATIENT. Inpatient status is judged to be reasonable and necessary in order to provide the required intensity of service to ensure the patient's safety. The patient's presenting  symptoms, physical exam findings, and initial radiographic and laboratory data in the context of their chronic comorbidities is felt to place them at high risk for further clinical deterioration. Furthermore, it is not anticipated that the patient will be medically stable for discharge from the hospital within 2 midnights of admission.   * I certify that at the point of admission it is my clinical judgment that the patient will require inpatient hospital care spanning beyond 2 midnights from the point of admission due to high intensity of service, high risk for further deterioration and high frequency of surveillance required.*   For questions or updates, please contact [facility] Please consult [email or web address] for contact info under     Signed, Donnice DELENA Primus, MD  01/02/2024 6:12 AM

## 2024-01-02 NOTE — ED Triage Notes (Signed)
 Per [facility], pt from home substernal sharp chest pain 10/10 X2 days.  Pt had one episode of emesis today., Pt reports diaphoretic earlier.    18G L and R AC Given 324 ASA and 1 nitroglycerin .  168/73 HR 64 100% RA

## 2024-01-02 NOTE — Plan of Care (Signed)
  Problem: Nutrition: Goal: Adequate nutrition will be maintained Outcome: Progressing   Problem: Pain Managment: Goal: General experience of comfort will improve and/or be controlled Outcome: Progressing

## 2024-01-02 NOTE — Progress Notes (Addendum)
 Pt seen by cardiology fellow this morning (see compete H&P)  S/p STEMI w/ PCI to the prox-mid RCA.   Lactic acid cleared, 2.9>>1.6>>1.2  He is stable w/o CP. No dyspnea. Resting comfortably.   NSR on tele, HR 60s. SBPs 130s   Echo done today, EF 25-30%, RV mildly reduced. No MR. IV normal in size w/ <50% respiratory  variability, suggesting right atrial pressure of 8 mmHg.   Will start spironolactone  25 mg daily   AHF team will pick up w/ daily rounds tomorrow.   Caffie Shed, PA-C

## 2024-01-02 NOTE — Progress Notes (Signed)
  Echocardiogram 2D Echocardiogram has been performed.  Joseph Hess 01/02/2024, 2:43 PM

## 2024-01-02 NOTE — Telephone Encounter (Signed)
 Patient Product/process development scientist completed.    The patient is insured through [employer]. Patient has [employer], may use a copay card, and/or apply for patient assistance if available.    Ran test claim for prasugrel  10 mg and the current 30 day co-pay is $6.23.   This test claim was processed through [facility]- copay amounts may vary at other pharmacies due to pharmacy/plan contracts, or as the patient moves through the different stages of their insurance plan.     Reyes Sharps, CPHT Pharmacy Technician III Certified Patient Advocate [facility] Patient Advocate Team Direct Number: [phone]  Fax: [phone]

## 2024-01-02 NOTE — ED Provider Notes (Signed)
 Emergency Department Provider Note   I have reviewed the triage vital signs and the nursing notes.   HISTORY  Chief Complaint Chest Pain and Code STEMI   HPI Joseph Hess is a 58 y.o. male history of pancreatitis and hypertension presents to the emergency department with 2 days of sharp, central chest pain radiating to the left arm.  Symptoms worsened this evening and he developed diaphoresis and vomiting.  Patient states he became concerned he was having a heart attack, googled the symptoms, and promptly called EMS.  He states his pain is not typical of his pancreatitis.  He denies any drug use.  No history of heart attacks in the past.   EMS arrived on scene to find him diaphoretic with questionable EKG. They gave aspirin  and nitroglycerin  en route to the ED.    Past Medical History:  Diagnosis Date   Cavernous hemangioma of liver    Chronic pancreatitis (HCC)    Hx of adenomatous colonic polyps 03/30/2015   Pancreatic insufficiency     Review of Systems  Constitutional: No fever/chills Cardiovascular: Positive chest pain. Respiratory: Denies shortness of breath. Gastrointestinal: No abdominal pain.  No nausea, no vomiting.   Skin: Negative for rash. Neurological: Negative for headaches. ____________________________________________   PHYSICAL EXAM:  VITAL SIGNS: Vitals:   01/02/24 0530 01/02/24 0544  BP: (!) 133/92   Pulse: 68   Resp: (!) 23   Temp: 98.6 F (37 C)   SpO2:  100%    Constitutional: Alert and oriented. Well appearing and in no acute distress. Eyes: Conjunctivae are normal.  Head: Atraumatic. Nose: No congestion/rhinnorhea. Mouth/Throat: Mucous membranes are moist.   Neck: No stridor.   Cardiovascular: Normal rate, regular rhythm. Good peripheral circulation. Grossly normal heart sounds.   Respiratory: Normal respiratory effort.  No retractions. Lungs CTAB. Gastrointestinal: Soft and nontender. No distention.  Musculoskeletal: No lower  extremity tenderness nor edema. No gross deformities of extremities. Neurologic:  Normal speech and language.  Skin:  Skin is warm, dry and intact. No rash noted.   ____________________________________________   LABS (all labs ordered are listed, but only abnormal results are displayed)  Labs Reviewed  HEMOGLOBIN A1C - Abnormal; Notable for the following components:      Result Value   Hgb A1c MFr Bld 6.9 (*)    All other components within normal limits  CBC WITH DIFFERENTIAL/PLATELET - Abnormal; Notable for the following components:   Hemoglobin 12.8 (*)    All other components within normal limits  COMPREHENSIVE METABOLIC PANEL WITH GFR - Abnormal; Notable for the following components:   Potassium 3.0 (*)    Glucose, Bld 138 (*)    Calcium  8.6 (*)    Albumin 3.4 (*)    AST 125 (*)    All other components within normal limits  I-STAT CG4 LACTIC ACID, ED - Abnormal; Notable for the following components:   Lactic Acid, Venous 2.9 (*)    All other components within normal limits  POCT I-STAT, CHEM 8 - Abnormal; Notable for the following components:   Potassium 2.9 (*)    Glucose, Bld 153 (*)    TCO2 20 (*)    Hemoglobin 12.9 (*)    HCT 38.0 (*)    All other components within normal limits  TROPONIN I (HIGH SENSITIVITY) - Abnormal; Notable for the following components:   Troponin I (High Sensitivity) 11,580 (*)    All other components within normal limits  PROTIME-INR  APTT  LIPID PANEL  HIV ANTIBODY (ROUTINE TESTING W REFLEX)  BASIC METABOLIC PANEL WITH GFR  LIPOPROTEIN A (LPA)  MAGNESIUM   TSH  LACTIC ACID, PLASMA  CG4 I-STAT (LACTIC ACID)  POCT ACTIVATED CLOTTING TIME  POCT ACTIVATED CLOTTING TIME   ____________________________________________  EKG  Inferior ST changes with lateral depressions consistent with STEMI.   ____________________________________________   PROCEDURES  Procedure(s) performed:   Procedures  CRITICAL CARE Performed by: Fonda KANDICE Law Total critical care time: 31 minutes Critical care time was exclusive of separately billable procedures and treating other patients. Critical care was necessary to treat or prevent imminent or life-threatening deterioration. Critical care was time spent personally by me on the following activities: development of treatment plan with patient and/or surrogate as well as nursing, discussions with consultants, evaluation of patient's response to treatment, examination of patient, obtaining history from patient or surrogate, ordering and performing treatments and interventions, ordering and review of laboratory studies, ordering and review of radiographic studies, pulse oximetry and re-evaluation of patient's condition.  Fonda Law, MD Emergency Medicine  ____________________________________________   INITIAL IMPRESSION / ASSESSMENT AND PLAN / ED COURSE  Pertinent labs & imaging results that were available during my care of the patient were reviewed by me and considered in my medical decision making (see chart for details).   This patient is Presenting for Evaluation of CP, which does require a range of treatment options, and is a complaint that involves a high risk of morbidity and mortality.  The Differential Diagnoses includes but is not exclusive to acute coronary syndrome, aortic dissection, pulmonary embolism, cardiac tamponade, community-acquired pneumonia, pericarditis, musculoskeletal chest wall pain, etc.   Critical Interventions-    Medications  0.9 %  sodium chloride  infusion (10 mL/hr Intravenous New Bag/Given 01/02/24 0527)  aspirin  EC tablet 81 mg (has no administration in time range)  nitroGLYCERIN  (NITROSTAT ) SL tablet 0.4 mg (has no administration in time range)  acetaminophen  (TYLENOL ) tablet 650 mg (has no administration in time range)  ondansetron  (ZOFRAN ) injection 4 mg (has no administration in time range)  carvedilol  (COREG ) tablet 3.125 mg (has no administration  in time range)  atorvastatin  (LIPITOR) tablet 80 mg (has no administration in time range)  prasugrel  (EFFIENT ) tablet 10 mg (has no administration in time range)  heparin  injection 3,800 Units (3,800 Units Intravenous Given 01/02/24 0517)  cangrelor  (KENGREAL ) 50,000 mcg in sodium chloride  0.9 % 250 mL (200 mcg/mL) infusion (4 mcg/kg/min  63.5 kg Intravenous New Bag/Given 01/02/24 0608)    Reassessment after intervention: pain improved.    I did obtain Additional Historical Information from EMS.    Clinical Laboratory Tests Ordered, included Troponin > 11,000. No leukocytosis. Platelets normal.   Radiologic Tests Ordered, included CXR. I independently interpreted the images and agree with radiology interpretation.   Cardiac Monitor Tracing which shows NSR.    Social Determinants of Health Risk patient is a smoker.   Consult complete with Cardiology. Plan for cath lab activation.   Medical Decision Making: Summary:  The patient presents to the emergency department with sharp chest pain for the past 2 days worsening this evening to the left arm with diaphoresis.  EKG transmitted by EMS showing evolving ST elevation inferiorly with some ST depressions along the lateral leads.  Advised activating code STEMI prior to ED arrival.   Reevaluation with update and discussion with patient. Plan to go emergently to the cath lab.    Patient's presentation is most consistent with acute presentation with potential threat to life or bodily  function.   Disposition: admit  ____________________________________________  FINAL CLINICAL IMPRESSION(S) / ED DIAGNOSES  Final diagnoses:  ST elevation myocardial infarction (STEMI), unspecified artery (HCC)    Note:  This document was prepared using Dragon voice recognition software and may include unintentional dictation errors.  Fonda Law, MD, [facility] Emergency Medicine    Kamrynn Melott, Fonda MATSU, MD 01/02/24 [phone]

## 2024-01-02 NOTE — Progress Notes (Signed)
 S/p RCA PCI in the setting of inferior STEMI. Lactate elevated on admission. Remaining labs and repeat lactate collect at 1100.

## 2024-01-03 ENCOUNTER — Other Ambulatory Visit (HOSPITAL_COMMUNITY): Payer: Self-pay

## 2024-01-03 ENCOUNTER — Telehealth (HOSPITAL_COMMUNITY): Payer: Self-pay | Admitting: Pharmacy Technician

## 2024-01-03 DIAGNOSIS — I2119 ST elevation (STEMI) myocardial infarction involving other coronary artery of inferior wall: Secondary | ICD-10-CM

## 2024-01-03 LAB — GLUCOSE, CAPILLARY
Glucose-Capillary: 234 mg/dL — ABNORMAL HIGH (ref 70–99)
Glucose-Capillary: 97 mg/dL (ref 70–99)
Glucose-Capillary: 163 mg/dL — ABNORMAL HIGH (ref 70–99)
Glucose-Capillary: 181 mg/dL — ABNORMAL HIGH (ref 70–99)

## 2024-01-03 LAB — BASIC METABOLIC PANEL WITH GFR
Anion gap: 9 (ref 5–15)
BUN: 12 mg/dL (ref 6–20)
CO2: 24 mmol/L (ref 22–32)
Calcium: 8.8 mg/dL — ABNORMAL LOW (ref 8.9–10.3)
Chloride: 105 mmol/L (ref 98–111)
Creatinine, Ser: 0.92 mg/dL (ref 0.61–1.24)
GFR, Estimated: >60 mL/min (ref >60)
Glucose, Bld: 140 mg/dL — ABNORMAL HIGH (ref 70–99)
Potassium: 4.4 mmol/L (ref 3.5–5.1)
Sodium: 138 mmol/L (ref 135–145)

## 2024-01-03 LAB — CBC
HCT: 40 % (ref 39.0–52.0)
Hemoglobin: 13 g/dL (ref 13.0–17.0)
MCH: 27.2 pg (ref 26.0–34.0)
MCHC: 32.5 g/dL (ref 30.0–36.0)
MCV: 83.7 fL (ref 80.0–100.0)
Platelets: 286 K/uL (ref 150–400)
RBC: 4.78 MIL/uL (ref 4.22–5.81)
RDW: 14.7 % (ref 11.5–15.5)
WBC: 8.6 K/uL (ref 4.0–10.5)
nRBC: 0 % (ref 0.0–0.2)

## 2024-01-03 LAB — MAGNESIUM: Magnesium: 2 mg/dL (ref 1.7–2.4)

## 2024-01-03 LAB — LIPOPROTEIN A (LPA): Lipoprotein (a): 138.7 nmol/L — ABNORMAL HIGH (ref <75.0)

## 2024-01-03 MED ORDER — SACUBITRIL-VALSARTAN 24-26 MG PO TABS
1.0000 | ORAL_TABLET | Freq: Two times a day (BID) | ORAL | 3 refills | Status: DC
  Filled 2024-01-03: qty 60, 30d supply, fill #0

## 2024-01-03 MED ORDER — SACUBITRIL-VALSARTAN 24-26 MG PO TABS: 1.0000 | ORAL_TABLET | Freq: Two times a day (BID) | ORAL | 3 refills | Status: DC

## 2024-01-03 MED ORDER — INSULIN ASPART 100 UNIT/ML IJ SOLN
0.0000 [IU] | Freq: Three times a day (TID) | INTRAMUSCULAR | Status: DC
  Administered 2024-01-03 – 2024-01-04 (×3): 2 [IU] via SUBCUTANEOUS

## 2024-01-03 MED ORDER — LOSARTAN POTASSIUM 50 MG PO TABS
50.0000 mg | ORAL_TABLET | Freq: Every day | ORAL | Status: DC
  Administered 2024-01-04 – 2024-01-05 (×2): 50 mg via ORAL
  Filled 2024-01-03 (×2): qty 1

## 2024-01-03 MED ORDER — SACUBITRIL-VALSARTAN 24-26 MG PO TABS
1.0000 | ORAL_TABLET | Freq: Two times a day (BID) | ORAL | Status: DC
  Administered 2024-01-03: 1 via ORAL
  Filled 2024-01-03: qty 1

## 2024-01-03 MED ORDER — EMPAGLIFLOZIN 10 MG PO TABS
10.0000 mg | ORAL_TABLET | Freq: Every day | ORAL | Status: DC
  Administered 2024-01-03 – 2024-01-05 (×3): 10 mg via ORAL
  Filled 2024-01-03 (×3): qty 1

## 2024-01-03 MED ORDER — INSULIN ASPART 100 UNIT/ML IJ SOLN: 0.0000 [IU] | Freq: Every day | INTRAMUSCULAR | Status: DC

## 2024-01-03 NOTE — Discharge Instructions (Addendum)
 Information about your medication: Effient  (anti-platelet agent)  Generic Name (Brand): prasugrel  (Effient ), once daily medication  PURPOSE: You are taking this medication along with aspirin  to lower your chance of having a heart attack, stroke, or blood clots in your heart stent. These can be fatal. Brilinta and aspirin  help prevent platelets from sticking together and forming a clot that can block an artery or your stent.   Common SIDE EFFECTS you may experience include: bruising or bleeding more easily, shortness of breath  Do not stop taking EFFIENT  without talking to the doctor who prescribes it for you. People who are treated with a stent and stop taking Effient  too soon, have a higher risk of getting a blood clot in the stent, having a heart attack, or dying. If you stop Effient  because of bleeding, or for other reasons, your risk of a heart attack or stroke may increase.   Tell all of your doctors and dentists that you are taking Effient . They should talk to the doctor who prescribed Brilinta for you before you have any surgery or invasive procedure.   Contact your health care provider if you experience: severe or uncontrollable bleeding, pink/red/brown urine, vomiting blood or vomit that looks like coffee grounds, red or black stools (looks like tar), coughing up blood or blood clots ---------------------------------------------------------------------------------------------------------------------- Information about your medication:  Beta Blocker (Heart rate/Blood Pressure-lowering agent)  Generic Name (Brand): metoprolol tartrate (Lopressor), metoprolol succinate (Toprol XL), carvedilol  (Coreg ), Bisoprolol (Zebeta)  PURPOSE: You are taking this medication to lower blood pressure and heart rate to help prevent further damage to your heart and to reduce your risk of death following your cardiac event.   Common SIDE EFFECTS you may experience include: fatigue, dizziness, diarrhea, or  nausea. This medication may also mask the signs of hypoglycemia  Take your medication exactly as prescribed.   Contact your health care provider if you experience: severely lower bllod pressure, syncope, palpitations, or chest pain muscle --------------------------------------------------------------------------------------------------------------------- Information about your medication: Statin (cholesterol-lowering agent)  Generic Name (Brand): atorvastatin  (Lipitor), pravastatin (Pravachol), rosuvastatin (Crestor), simvastatin (Zocor)  PURPOSE: You are taking this medication to lower your bad cholesterol (LDL) and to prevent heart attacks and strokes. Statins can also raise your good cholesterol (HDL).  Common SIDE EFFECTS you may experience include: muscle pain or weakness (especially in the legs) and upset stomach.  Take your medication exactly as prescribed. Do not eat large amounts of grapefruit or grapefruit juice while taking this medication.  Contact your health care provider if you experience: severe muscle pain that does not improve, dark urine, or yellowing of your skin or eyes. ----------------------------------------------------------------------------------------------------------------------

## 2024-01-03 NOTE — Telephone Encounter (Signed)
 Patient Product/process development scientist completed.    The patient is insured through [employer]. Patient has [employer], may use a copay card, and/or apply for patient assistance if available.    Ran test claim for Farxiga 10 mg and the current 30 day co-pay is $539.64 due to a $7500 deductible.  Ran test claim for Jardiance  10 mg and the current 30 day co-pay is $566.36 due to a $7500 deductible.  Ran test claim for Entresto  24-26 mg (Brand and Generic) and Not on Formulary   This test claim was processed through [facility]- copay amounts may vary at other pharmacies due to [employer], or as the patient moves through the different stages of their insurance plan.     Reyes Sharps, CPHT Pharmacy Technician III Certified Patient Advocate [facility] Patient Advocate Team Direct Number: [phone]  Fax: [phone]

## 2024-01-03 NOTE — Progress Notes (Addendum)
 CARDIAC REHAB PHASE I   PRE:  Rate/Rhythm: 78 NSR  BP:  Sitting: 124/79      SpO2: 98 RA  MODE:  Ambulation: 340 ft    POST:  Rate/Rhythm: 96 NSR  BP:  Sitting: 11/87      SpO2: 93 RA  Pt amb with CGA then supervision assist in the hallway, pt denies CP or other unusual symptoms.   Pt was educated on STEMI, stent card, stent location, Antiplatelet and ASA use, wt restrictions, no baths/daily wash-ups, s/s of infection, ex guidelines, s/s to stop exercising, NTG use and calling 911, heart healthy diet and low NA diet, complete smoking and Etoh cessation, and CRPII. Pt received MI book and materials on exercise, diet, and CRPII. Will refer to [facility].   We also discussed s/s of fluid retention and importance of daily weights. Pt does have a scale at home.   Pt is unsure if he will do program.   Garen FORBES Candy  MS, ACSM-CEP 9:26 AM 01/03/2024    Service time is from 0845 to 0926.

## 2024-01-03 NOTE — Progress Notes (Addendum)
 Advanced Heart Failure Rounding Note  Cardiologist: None  Chief Complaint: Inferior STEMI   Patient Profile   Joseph Hess is a 58 y.o. male with a substance use (tobacco, etoh, marijuana) who presents with CP and found to have inferior STEMI. S/p PCI to RCA. Echo w/ severely reduced LV function, EF 25-30%, RV mildly reduced.   Subjective:    Feels ok today. Denies CP. No dyspnea.   Rhythm stable on tele. SBPs 150s.    Objective:   Weight Range: 63.5 kg Body mass index is 19.52 kg/m.   Vital Signs:   Temp:  [97.9 F (36.6 C)-99 F (37.2 C)] 97.9 F (36.6 C) (08/14 2330) Pulse Rate:  [59-77] 72 (08/14 1659) Resp:  [15-26] 17 (08/15 0630) BP: (109-154)/(75-100) 138/88 (08/15 0630) SpO2:  [96 %-100 %] 97 % (08/15 0630) Last BM Date :  (PTA)  Weight change: Filed Weights   01/02/24 0508  Weight: 63.5 kg    Intake/Output:   Intake/Output Summary (Last 24 hours) at 01/03/2024 0727 Last data filed at 01/02/2024 2330 Gross per 24 hour  Intake 1294.05 ml  Output 825 ml  Net 469.05 ml      Physical Exam    General:  Well appearing. No resp difficulty HEENT: Normal Neck: Supple. JVP not elevated . Carotids 2+ bilat; no bruits. No lymphadenopathy or thyromegaly appreciated. Cor: PMI nondisplaced. Regular rate & rhythm. No rubs, gallops or murmurs. Lungs: Clear Abdomen: Soft, nontender, nondistended. No hepatosplenomegaly. No bruits or masses. Good bowel sounds. Extremities: No cyanosis, clubbing, rash, edema Neuro: Alert & orientedx3, cranial nerves grossly intact. moves all 4 extremities w/o difficulty. Affect pleasant   Telemetry   NSR 80s. No arrhthymias. Personally reviewed  EKG    N/A  Labs    CBC Recent Labs    01/02/24 0515 01/02/24 0558 01/03/24 0239  WBC 8.8  --  8.6  NEUTROABS 6.4  --   --   HGB 12.8* 12.9* 13.0  HCT 39.9 38.0* 40.0  MCV 84.0  --  83.7  PLT 293  --  286   Basic Metabolic Panel Recent Labs    91/85/74 0515  01/02/24 0558 01/02/24 1135  NA 135 137 138  K 3.0* 2.9* 3.6  CL 105 101 108  CO2 22  --  20*  GLUCOSE 138* 153* 118*  BUN 9 7 6   CREATININE 0.90 0.80 0.76  CALCIUM  8.6*  --  8.6*  MG  --   --  1.8   Liver Function Tests Recent Labs    01/02/24 0515  AST 125*  ALT 33  ALKPHOS 76  BILITOT 0.4  PROT 6.7  ALBUMIN 3.4*   No results for input(s): LIPASE, AMYLASE in the last 72 hours. Cardiac Enzymes No results for input(s): CKTOTAL, CKMB, CKMBINDEX, TROPONINI in the last 72 hours.  BNP: BNP (last 3 results) No results for input(s): BNP in the last 8760 hours.  ProBNP (last 3 results) No results for input(s): PROBNP in the last 8760 hours.   D-Dimer No results for input(s): DDIMER in the last 72 hours. Hemoglobin A1C Recent Labs    01/02/24 0515  HGBA1C 6.9*   Fasting Lipid Panel Recent Labs    01/02/24 0515  CHOL 175  HDL 78  LDLCALC 85  TRIG 60  CHOLHDL 2.2   Thyroid Function Tests Recent Labs    01/02/24 1135  TSH 2.208    Other results:   Imaging    ECHOCARDIOGRAM COMPLETE Result Date:  01/02/2024    ECHOCARDIOGRAM REPORT   Patient Name:   Joseph Hess Date of Exam: 01/02/2024 Medical Rec #:  [id number]       Height:       71.0 in Accession #:    [id number]      Weight:       140.0 lb Date of Birth:  05-Mar-1966      BSA:          1.812 m Patient Age:    57 years        BP:           130/91 mmHg Patient Gender: M               HR:           64 bpm. Exam Location:  Inpatient Procedure: 2D Echo (Both Spectral and Color Flow Doppler were utilized during            procedure). Indications:    acute myocardial infarction  History:        Patient has no prior history of Echocardiogram examinations.                 CAD; Risk Factors:Current Smoker.  Sonographer:    Tinnie Barefoot RDCS Referring Phys: [id number] GORDY BERGAMO IMPRESSIONS  1. Left ventricular ejection fraction, by estimation, is 25 to 30%. The left ventricle has severely decreased  function. The left ventricle demonstrates global hypokinesis. Left ventricular diastolic parameters are consistent with Grade I diastolic dysfunction (impaired relaxation).  2. Right ventricular systolic function is mildly reduced. The right ventricular size is mildly enlarged. There is normal pulmonary artery systolic pressure. The estimated right ventricular systolic pressure is 33.4 mmHg.  3. The mitral valve is grossly normal. No evidence of mitral valve regurgitation. No evidence of mitral stenosis.  4. The aortic valve is tricuspid. Aortic valve regurgitation is not visualized. No aortic stenosis is present.  5. The inferior vena cava is normal in size with <50% respiratory variability, suggesting right atrial pressure of 8 mmHg. FINDINGS  Left Ventricle: Left ventricular ejection fraction, by estimation, is 25 to 30%. The left ventricle has severely decreased function. The left ventricle demonstrates global hypokinesis. The left ventricular internal cavity size was normal in size. There is no left ventricular hypertrophy. Left ventricular diastolic parameters are consistent with Grade I diastolic dysfunction (impaired relaxation). Right Ventricle: The right ventricular size is mildly enlarged. No increase in right ventricular wall thickness. Right ventricular systolic function is mildly reduced. There is normal pulmonary artery systolic pressure. The tricuspid regurgitant velocity  is 2.52 m/s, and with an assumed right atrial pressure of 8 mmHg, the estimated right ventricular systolic pressure is 33.4 mmHg. Left Atrium: Left atrial size was normal in size. Right Atrium: Right atrial size was normal in size. Pericardium: There is no evidence of pericardial effusion. Mitral Valve: The mitral valve is grossly normal. No evidence of mitral valve regurgitation. No evidence of mitral valve stenosis. Tricuspid Valve: The tricuspid valve is grossly normal. Tricuspid valve regurgitation is trivial. No evidence of  tricuspid stenosis. Aortic Valve: The aortic valve is tricuspid. Aortic valve regurgitation is not visualized. No aortic stenosis is present. Pulmonic Valve: The pulmonic valve was grossly normal. Pulmonic valve regurgitation is not visualized. No evidence of pulmonic stenosis. Aorta: The aortic root is normal in size and structure. Venous: The inferior vena cava is normal in size with less than 50% respiratory variability, suggesting right atrial pressure of 8 mmHg.  IAS/Shunts: The atrial septum is grossly normal.  LEFT VENTRICLE PLAX 2D LVIDd:         5.00 cm LVIDs:         4.10 cm LV PW:         0.80 cm LV IVS:        0.80 cm LVOT diam:     2.00 cm LVOT Area:     3.14 cm  LV Volumes (MOD) LV vol d, MOD A2C: 88.2 ml LV vol d, MOD A4C: 88.7 ml LV vol s, MOD A2C: 62.0 ml LV vol s, MOD A4C: 55.4 ml LV SV MOD A2C:     26.2 ml LV SV MOD A4C:     88.7 ml LV SV MOD BP:      29.6 ml RIGHT VENTRICLE            IVC RV Basal diam:  2.90 cm    IVC diam: 1.80 cm RV S prime:     7.30 cm/s TAPSE (M-mode): 1.1 cm LEFT ATRIUM             Index        RIGHT ATRIUM           Index LA diam:        3.10 cm 1.71 cm/m   RA Area:     15.70 cm LA Vol (A2C):   41.3 ml 22.79 ml/m  RA Volume:   43.30 ml  23.90 ml/m LA Vol (A4C):   30.4 ml 16.78 ml/m LA Biplane Vol: 36.8 ml 20.31 ml/m   AORTA Ao Root diam: 3.20 cm MITRAL VALVE               TRICUSPID VALVE MV Area (PHT): 3.03 cm    TR Peak grad:   25.4 mmHg MV Decel Time: 250 msec    TR Vmax:        252.00 cm/s MV E velocity: 43.90 cm/s MV A velocity: 56.00 cm/s  SHUNTS MV E/A ratio:  0.78        Systemic Diam: 2.00 cm Darryle Decent MD Electronically signed by Darryle Decent MD Signature Date/Time: 01/02/2024/3:16:06 PM    Final      Medications:     Scheduled Medications:  aspirin  EC  81 mg Oral Daily   atorvastatin   80 mg Oral Daily   carvedilol   3.125 mg Oral BID WC   Chlorhexidine  Gluconate Cloth  6 each Topical Daily   feeding supplement  237 mL Oral BID BM   prasugrel    10 mg Oral Daily   sodium chloride  flush  3 mL Intravenous Q12H   spironolactone   25 mg Oral Daily    Infusions:   PRN Medications: acetaminophen , nitroGLYCERIN , ondansetron  (ZOFRAN ) IV, mouth rinse, sodium chloride  flush    Assessment/Plan   1. Inferior STEMI/CAD - LHC w/ severe single vessel occlusive disease, 100% mRCA s/p PCI. LAD no disease, RI w/ 40% prox stenosis  - CP fee - DAPT w/ ASA + Effient  - Atorva 80 mg daily  - Coreg  3.125 mg bid - ambulate w/ CR   2. 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 CAD per above but CM out of proportion to degree of CAD, suspect ETOH could be contributing  - euvolemic on exam. SBPs 150s - Insurance will not cover Enresto. Will start Losartan  50 mg daily  - Continue Spironolactone  25 mg daily  - Start Jardiance  10 mg daily (LVEDP elevated  on cath, has DM)   3. HTN - moderately elevated - GDMT up titration per above  4. Substance Use - Tobacco and ETOH - advised to quit   5. T2DM - Hgb A1c 6.9 - on SSI + statin  - will add Jardiance  10 mg daily given HF and CAD    Can transfer out of ICU today. Will keep on CHF rounding team. Will need f/u in the [facility].    Length of Stay: 1  Caffie Shed, PA-C  01/03/2024, 7:27 AM  Advanced Heart Failure Team Pager [phone] (M-F; 7a - 5p)  Please contact [facility] for night-coverage after hours (5p -7a ) and weekends on [email or web address]

## 2024-01-03 NOTE — Plan of Care (Signed)
  Problem: Fluid Volume: Goal: Ability to maintain a balanced intake and output will improve Outcome: Progressing   Problem: Nutritional: Goal: Maintenance of adequate nutrition will improve Outcome: Progressing   Problem: Skin Integrity: Goal: Risk for impaired skin integrity will decrease Outcome: Progressing

## 2024-01-04 ENCOUNTER — Other Ambulatory Visit (HOSPITAL_COMMUNITY): Payer: Self-pay

## 2024-01-04 LAB — BASIC METABOLIC PANEL WITH GFR
Anion gap: 8 (ref 5–15)
BUN: 24 mg/dL — ABNORMAL HIGH (ref 6–20)
CO2: 21 mmol/L — ABNORMAL LOW (ref 22–32)
Calcium: 8.7 mg/dL — ABNORMAL LOW (ref 8.9–10.3)
Chloride: 105 mmol/L (ref 98–111)
Creatinine, Ser: 0.99 mg/dL (ref 0.61–1.24)
GFR, Estimated: >60 mL/min (ref >60)
Glucose, Bld: 144 mg/dL — ABNORMAL HIGH (ref 70–99)
Potassium: 3.8 mmol/L (ref 3.5–5.1)
Sodium: 134 mmol/L — ABNORMAL LOW (ref 135–145)

## 2024-01-04 LAB — GLUCOSE, CAPILLARY
Glucose-Capillary: 228 mg/dL — ABNORMAL HIGH (ref 70–99)
Glucose-Capillary: 131 mg/dL — ABNORMAL HIGH (ref 70–99)
Glucose-Capillary: 179 mg/dL — ABNORMAL HIGH (ref 70–99)
Glucose-Capillary: 121 mg/dL — ABNORMAL HIGH (ref 70–99)

## 2024-01-04 LAB — CBC
HCT: 41.7 % (ref 39.0–52.0)
Hemoglobin: 13.5 g/dL (ref 13.0–17.0)
MCH: 27.2 pg (ref 26.0–34.0)
MCHC: 32.4 g/dL (ref 30.0–36.0)
MCV: 83.9 fL (ref 80.0–100.0)
Platelets: 297 K/uL (ref 150–400)
RBC: 4.97 MIL/uL (ref 4.22–5.81)
RDW: 14.6 % (ref 11.5–15.5)
WBC: 7.9 K/uL (ref 4.0–10.5)
nRBC: 0 % (ref 0.0–0.2)

## 2024-01-04 LAB — MAGNESIUM: Magnesium: 2 mg/dL (ref 1.7–2.4)

## 2024-01-04 MED ORDER — PRASUGREL HCL 10 MG PO TABS
10.0000 mg | ORAL_TABLET | Freq: Every day | ORAL | 3 refills | Status: DC
  Filled 2024-01-04: qty 90, 90d supply, fill #0

## 2024-01-04 MED ORDER — SPIRONOLACTONE 25 MG PO TABS
25.0000 mg | ORAL_TABLET | Freq: Every day | ORAL | 2 refills | Status: DC
  Filled 2024-01-04: qty 30, 30d supply, fill #0

## 2024-01-04 MED ORDER — CARVEDILOL 3.125 MG PO TABS
3.1250 mg | ORAL_TABLET | Freq: Two times a day (BID) | ORAL | 2 refills | Status: DC
  Filled 2024-01-04: qty 60, 30d supply, fill #0

## 2024-01-04 MED ORDER — ALUM & MAG HYDROXIDE-SIMETH 200-200-20 MG/5ML PO SUSP
30.0000 mL | ORAL | Status: DC | PRN
  Administered 2024-01-04: 30 mL via ORAL
  Filled 2024-01-04: qty 30

## 2024-01-04 MED ORDER — ATORVASTATIN CALCIUM 80 MG PO TABS
80.0000 mg | ORAL_TABLET | Freq: Every day | ORAL | 2 refills | Status: DC
  Filled 2024-01-04: qty 30, 30d supply, fill #0

## 2024-01-04 MED ORDER — LOSARTAN POTASSIUM 50 MG PO TABS
50.0000 mg | ORAL_TABLET | Freq: Every day | ORAL | 2 refills | Status: DC
  Filled 2024-01-04: qty 30, 30d supply, fill #0

## 2024-01-04 MED ORDER — ASPIRIN 81 MG PO TBEC
81.0000 mg | DELAYED_RELEASE_TABLET | Freq: Every day | ORAL | 3 refills | Status: DC
  Filled 2024-01-04: qty 90, 90d supply, fill #0

## 2024-01-04 MED ORDER — EMPAGLIFLOZIN 10 MG PO TABS
10.0000 mg | ORAL_TABLET | Freq: Every day | ORAL | 2 refills | Status: DC
  Filled 2024-01-04: qty 30, 30d supply, fill #0

## 2024-01-04 MED ORDER — NITROGLYCERIN 0.4 MG SL SUBL
0.4000 mg | SUBLINGUAL_TABLET | SUBLINGUAL | 2 refills | Status: AC | PRN
  Filled 2024-01-04: qty 25, 3d supply, fill #0

## 2024-01-04 NOTE — Progress Notes (Signed)
 CCMD advised pt had a 5 beat run of vtach. Pt asymptomatic.

## 2024-01-04 NOTE — Progress Notes (Signed)
 CARDIAC REHAB PHASE I   Followed up with patient from education given yesterday. He said Garen was very thorough and he had no questions, reviewed some education and he said he's feeling ok, about the same as yesterday. Maybe a little better. Patient did not want to walk until after he ate. Left call bell in reach. All questions were answered and pt verbalized understanding.  Zeola Brys S Dallys Nowakowski ACSM-CEP 01/04/2024 11:56 AM

## 2024-01-04 NOTE — Progress Notes (Signed)
   Patient will likely be discharged tomorrow. TOC Pharmacy is closed tomorrow so will go ahead and send current medications in so they can be filled today and he can leave with them tomorrow. If any medications are adjusted tomorrow, we will need to update this.   Margorie Renner E Jonique Kulig, PA-C 01/04/2024 4:28 PM

## 2024-01-04 NOTE — Plan of Care (Signed)
  Problem: Education: Goal: Knowledge of General Education information will improve Description: Including pain rating scale, medication(s)/side effects and non-pharmacologic comfort measures Outcome: Progressing   Problem: Health Behavior/Discharge Planning: Goal: Ability to manage health-related needs will improve Outcome: Progressing   Problem: Clinical Measurements: Goal: Ability to maintain clinical measurements within normal limits will improve Outcome: Progressing Goal: Will remain free from infection Outcome: Progressing Goal: Diagnostic test results will improve Outcome: Progressing Goal: Respiratory complications will improve Outcome: Progressing Goal: Cardiovascular complication will be avoided Outcome: Progressing   Problem: Activity: Goal: Risk for activity intolerance will decrease Outcome: Progressing   Problem: Nutrition: Goal: Adequate nutrition will be maintained Outcome: Progressing   Problem: Coping: Goal: Level of anxiety will decrease Outcome: Progressing   Problem: Elimination: Goal: Will not experience complications related to bowel motility Outcome: Progressing Goal: Will not experience complications related to urinary retention Outcome: Progressing   Problem: Pain Managment: Goal: General experience of comfort will improve and/or be controlled Outcome: Progressing   Problem: Safety: Goal: Ability to remain free from injury will improve Outcome: Progressing   Problem: Skin Integrity: Goal: Risk for impaired skin integrity will decrease Outcome: Progressing   Problem: Education: Goal: Understanding of cardiac disease, CV risk reduction, and recovery process will improve Outcome: Progressing Goal: Individualized Educational Video(s) Outcome: Progressing   Problem: Activity: Goal: Ability to tolerate increased activity will improve Outcome: Progressing   Problem: Cardiac: Goal: Ability to achieve and maintain adequate cardiovascular  perfusion will improve Outcome: Progressing   Problem: Health Behavior/Discharge Planning: Goal: Ability to safely manage health-related needs after discharge will improve Outcome: Progressing   Problem: Education: Goal: Understanding of CV disease, CV risk reduction, and recovery process will improve Outcome: Progressing Goal: Individualized Educational Video(s) Outcome: Progressing   Problem: Activity: Goal: Ability to return to baseline activity level will improve Outcome: Progressing   Problem: Cardiovascular: Goal: Ability to achieve and maintain adequate cardiovascular perfusion will improve Outcome: Progressing Goal: Vascular access site(s) Level 0-1 will be maintained Outcome: Progressing   Problem: Health Behavior/Discharge Planning: Goal: Ability to safely manage health-related needs after discharge will improve Outcome: Progressing   Problem: Education: Goal: Ability to describe self-care measures that may prevent or decrease complications (Diabetes Survival Skills Education) will improve Outcome: Progressing Goal: Individualized Educational Video(s) Outcome: Progressing   Problem: Coping: Goal: Ability to adjust to condition or change in health will improve Outcome: Progressing   Problem: Fluid Volume: Goal: Ability to maintain a balanced intake and output will improve Outcome: Progressing   Problem: Health Behavior/Discharge Planning: Goal: Ability to identify and utilize available resources and services will improve Outcome: Progressing Goal: Ability to manage health-related needs will improve Outcome: Progressing   Problem: Metabolic: Goal: Ability to maintain appropriate glucose levels will improve Outcome: Progressing   Problem: Nutritional: Goal: Maintenance of adequate nutrition will improve Outcome: Progressing Goal: Progress toward achieving an optimal weight will improve Outcome: Progressing   Problem: Skin Integrity: Goal: Risk for  impaired skin integrity will decrease Outcome: Progressing   Problem: Tissue Perfusion: Goal: Adequacy of tissue perfusion will improve Outcome: Progressing

## 2024-01-04 NOTE — Progress Notes (Addendum)
 Advanced Heart Failure Rounding Note  Cardiologist: None  Chief Complaint: Inferior STEMI   Patient Profile   Joseph Hess is a 58 y.o. male with a substance use (tobacco, etoh, marijuana) who presents with CP and found to have inferior STEMI. S/p PCI to RCA. Echo w/ severely reduced LV function, EF 25-30%, RV mildly reduced.   Subjective:    Feels ok today. Denies CP. No dyspnea.   Rhythm stable on tele. SBPs 150s.    Objective:   Weight Range: 57.7 kg Body mass index is 17.74 kg/m.   Vital Signs:   Temp:  [98 F (36.7 C)-98.3 F (36.8 C)] 98.3 F (36.8 C) (08/16 0728) Pulse Rate:  [83-88] 86 (08/16 0728) Resp:  [17-19] 18 (08/16 0728) BP: (127-148)/(81-116) 135/93 (08/16 0728) SpO2:  [95 %-100 %] 100 % (08/16 0728) Weight:  [57.7 kg] 57.7 kg (08/15 1700) Last BM Date : 01/01/24  Weight change: Filed Weights   01/02/24 0508 01/03/24 1700  Weight: 63.5 kg 57.7 kg    Intake/Output:   Intake/Output Summary (Last 24 hours) at 01/04/2024 1111 Last data filed at 01/04/2024 0732 Gross per 24 hour  Intake 598 ml  Output 1250 ml  Net -652 ml      Physical Exam    General: lying flat in bed No resp difficulty HEENT: normal Neck: supple. no JVD. Carotids 2+ bilat; no bruits. No lymphadenopathy or thryomegaly appreciated. Cor: PMI nondisplaced. Regular rate & rhythm. No rubs, gallops or murmurs. Lungs: clear Abdomen: soft, nontender, nondistended. No hepatosplenomegaly. No bruits or masses. Good bowel sounds. Extremities: no cyanosis, clubbing, rash, edema Neuro: alert & orientedx3, cranial nerves grossly intact. moves all 4 extremities w/o difficulty. Affect pleasant    Telemetry   NSR 80s Personally reviewed   Labs    CBC Recent Labs    01/02/24 0515 01/02/24 0558 01/03/24 0239 01/04/24 0455  WBC 8.8  --  8.6 7.9  NEUTROABS 6.4  --   --   --   HGB 12.8*   < > 13.0 13.5  HCT 39.9   < > 40.0 41.7  MCV 84.0  --  83.7 83.9  PLT 293  --   286 297   < > = values in this interval not displayed.   Basic Metabolic Panel Recent Labs    91/84/74 1010 01/04/24 0455  NA 138 134*  K 4.4 3.8  CL 105 105  CO2 24 21*  GLUCOSE 140* 144*  BUN 12 24*  CREATININE 0.92 0.99  CALCIUM  8.8* 8.7*  MG 2.0 2.0   Liver Function Tests Recent Labs    01/02/24 0515  AST 125*  ALT 33  ALKPHOS 76  BILITOT 0.4  PROT 6.7  ALBUMIN 3.4*   No results for input(s): LIPASE, AMYLASE in the last 72 hours. Cardiac Enzymes No results for input(s): CKTOTAL, CKMB, CKMBINDEX, TROPONINI in the last 72 hours.  BNP: BNP (last 3 results) No results for input(s): BNP in the last 8760 hours.  ProBNP (last 3 results) No results for input(s): PROBNP in the last 8760 hours.   D-Dimer No results for input(s): DDIMER in the last 72 hours. Hemoglobin A1C Recent Labs    01/02/24 0515  HGBA1C 6.9*   Fasting Lipid Panel Recent Labs    01/02/24 0515  CHOL 175  HDL 78  LDLCALC 85  TRIG 60  CHOLHDL 2.2   Thyroid Function Tests Recent Labs    01/02/24 1135  TSH 2.208  Other results:   Imaging    No results found.    Medications:     Scheduled Medications:  aspirin  EC  81 mg Oral Daily   atorvastatin   80 mg Oral Daily   carvedilol   3.125 mg Oral BID WC   Chlorhexidine  Gluconate Cloth  6 each Topical Daily   empagliflozin   10 mg Oral Daily   feeding supplement  237 mL Oral BID BM   insulin  aspart  0-5 Units Subcutaneous QHS   insulin  aspart  0-9 Units Subcutaneous TID WC   losartan   50 mg Oral Daily   prasugrel   10 mg Oral Daily   sodium chloride  flush  3 mL Intravenous Q12H   spironolactone   25 mg Oral Daily    Infusions:   PRN Medications: acetaminophen , nitroGLYCERIN , ondansetron  (ZOFRAN ) IV, mouth rinse, sodium chloride  flush    Assessment/Plan   1. CAD with Inferior STEMI/CAD - LHC w/ severe single vessel occlusive disease, 100% mRCA s/p PCI. LAD no disease, RI w/ 40% prox stenosis  -  DAPT w/ ASA + Effient  - Atorva 80 mg daily  - Coreg  3.125 mg bid - ambulate w/ CR   2. 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 CAD per above but CM out of proportion to degree of CAD, suspect ETOH could be contributing  - volume ok  - Insurance will not cover Etnresto. Continue Losartan  50 mg daily  - Continue Spironolactone  25 mg daily  - Continue Jardiance  10 mg daily  - Add carvedilol  3.125 bid  3. HTN - moderately elevated - continue GDMT  4. Substance Use - Tobacco and ETOH - advised to quit   5. T2DM - Hgb A1c 6.9 - on SSI + statin  - Jardiance  added  Likely home in AM    Length of Stay: 2  Toribio Fuel, MD  01/04/2024, 11:11 AM  Advanced Heart Failure Team Pager [phone] (M-F; 7a - 5p)  Please contact [facility] for night-coverage after hours (5p -7a ) and weekends on [email or web address]

## 2024-01-05 ENCOUNTER — Encounter: Payer: Self-pay | Admitting: Physician Assistant

## 2024-01-05 DIAGNOSIS — F1990 Other psychoactive substance use, unspecified, uncomplicated: Secondary | ICD-10-CM | POA: Insufficient documentation

## 2024-01-05 DIAGNOSIS — E119 Type 2 diabetes mellitus without complications: Secondary | ICD-10-CM

## 2024-01-05 DIAGNOSIS — I10 Essential (primary) hypertension: Secondary | ICD-10-CM | POA: Insufficient documentation

## 2024-01-05 DIAGNOSIS — I5021 Acute systolic (congestive) heart failure: Secondary | ICD-10-CM

## 2024-01-05 DIAGNOSIS — I2510 Atherosclerotic heart disease of native coronary artery without angina pectoris: Secondary | ICD-10-CM | POA: Insufficient documentation

## 2024-01-05 LAB — MAGNESIUM: Magnesium: 2.2 mg/dL (ref 1.7–2.4)

## 2024-01-05 LAB — BASIC METABOLIC PANEL WITH GFR
Anion gap: 6 (ref 5–15)
BUN: 19 mg/dL (ref 6–20)
CO2: 21 mmol/L — ABNORMAL LOW (ref 22–32)
Calcium: 8.7 mg/dL — ABNORMAL LOW (ref 8.9–10.3)
Chloride: 107 mmol/L (ref 98–111)
Creatinine, Ser: 0.78 mg/dL (ref 0.61–1.24)
GFR, Estimated: >60 mL/min (ref >60)
Glucose, Bld: 114 mg/dL — ABNORMAL HIGH (ref 70–99)
Potassium: 4.2 mmol/L (ref 3.5–5.1)
Sodium: 134 mmol/L — ABNORMAL LOW (ref 135–145)

## 2024-01-05 LAB — CBC
HCT: 42.8 % (ref 39.0–52.0)
Hemoglobin: 14 g/dL (ref 13.0–17.0)
MCH: 27.5 pg (ref 26.0–34.0)
MCHC: 32.7 g/dL (ref 30.0–36.0)
MCV: 83.9 fL (ref 80.0–100.0)
Platelets: 298 K/uL (ref 150–400)
RBC: 5.1 MIL/uL (ref 4.22–5.81)
RDW: 14.3 % (ref 11.5–15.5)
WBC: 7.9 K/uL (ref 4.0–10.5)
nRBC: 0 % (ref 0.0–0.2)

## 2024-01-05 LAB — GLUCOSE, CAPILLARY
Glucose-Capillary: 128 mg/dL — ABNORMAL HIGH (ref 70–99)
Glucose-Capillary: 166 mg/dL — ABNORMAL HIGH (ref 70–99)

## 2024-01-05 NOTE — Plan of Care (Signed)
 Problem: Education: Goal: Knowledge of General Education information will improve Description: Including pain rating scale, medication(s)/side effects and non-pharmacologic comfort measures 01/05/2024 1211 by Emil Roselie SAUNDERS, RN Outcome: Adequate for Discharge 01/05/2024 1211 by Emil Roselie SAUNDERS, RN Outcome: Adequate for Discharge   Problem: Health Behavior/Discharge Planning: Goal: Ability to manage health-related needs will improve 01/05/2024 1211 by Emil Roselie SAUNDERS, RN Outcome: Adequate for Discharge 01/05/2024 1211 by Emil Roselie SAUNDERS, RN Outcome: Adequate for Discharge   Problem: Clinical Measurements: Goal: Ability to maintain clinical measurements within normal limits will improve 01/05/2024 1211 by Emil Roselie SAUNDERS, RN Outcome: Adequate for Discharge 01/05/2024 1211 by Emil Roselie SAUNDERS, RN Outcome: Adequate for Discharge Goal: Will remain free from infection 01/05/2024 1211 by Emil Roselie SAUNDERS, RN Outcome: Adequate for Discharge 01/05/2024 1211 by Emil Roselie SAUNDERS, RN Outcome: Adequate for Discharge Goal: Diagnostic test results will improve 01/05/2024 1211 by Emil Roselie SAUNDERS, RN Outcome: Adequate for Discharge 01/05/2024 1211 by Emil Roselie SAUNDERS, RN Outcome: Adequate for Discharge Goal: Respiratory complications will improve 01/05/2024 1211 by Emil Roselie SAUNDERS, RN Outcome: Adequate for Discharge 01/05/2024 1211 by Emil Roselie SAUNDERS, RN Outcome: Adequate for Discharge Goal: Cardiovascular complication will be avoided 01/05/2024 1211 by Emil Roselie SAUNDERS, RN Outcome: Adequate for Discharge 01/05/2024 1211 by Emil Roselie SAUNDERS, RN Outcome: Adequate for Discharge   Problem: Activity: Goal: Risk for activity intolerance will decrease 01/05/2024 1211 by Emil Roselie SAUNDERS, RN Outcome: Adequate for Discharge 01/05/2024 1211 by Emil Roselie SAUNDERS, RN Outcome: Adequate for Discharge   Problem: Nutrition: Goal: Adequate nutrition will be  maintained 01/05/2024 1211 by Emil Roselie SAUNDERS, RN Outcome: Adequate for Discharge 01/05/2024 1211 by Emil Roselie SAUNDERS, RN Outcome: Adequate for Discharge   Problem: Coping: Goal: Level of anxiety will decrease 01/05/2024 1211 by Emil Roselie SAUNDERS, RN Outcome: Adequate for Discharge 01/05/2024 1211 by Emil Roselie SAUNDERS, RN Outcome: Adequate for Discharge   Problem: Elimination: Goal: Will not experience complications related to bowel motility 01/05/2024 1211 by Emil Roselie SAUNDERS, RN Outcome: Adequate for Discharge 01/05/2024 1211 by Emil Roselie SAUNDERS, RN Outcome: Adequate for Discharge Goal: Will not experience complications related to urinary retention 01/05/2024 1211 by Emil Roselie SAUNDERS, RN Outcome: Adequate for Discharge 01/05/2024 1211 by Emil Roselie SAUNDERS, RN Outcome: Adequate for Discharge   Problem: Pain Managment: Goal: General experience of comfort will improve and/or be controlled 01/05/2024 1211 by Emil Roselie SAUNDERS, RN Outcome: Adequate for Discharge 01/05/2024 1211 by Emil Roselie SAUNDERS, RN Outcome: Adequate for Discharge   Problem: Safety: Goal: Ability to remain free from injury will improve 01/05/2024 1211 by Emil Roselie SAUNDERS, RN Outcome: Adequate for Discharge 01/05/2024 1211 by Emil Roselie SAUNDERS, RN Outcome: Adequate for Discharge   Problem: Skin Integrity: Goal: Risk for impaired skin integrity will decrease 01/05/2024 1211 by Emil Roselie SAUNDERS, RN Outcome: Adequate for Discharge 01/05/2024 1211 by Emil Roselie SAUNDERS, RN Outcome: Adequate for Discharge   Problem: Education: Goal: Understanding of cardiac disease, CV risk reduction, and recovery process will improve 01/05/2024 1211 by Emil Roselie SAUNDERS, RN Outcome: Adequate for Discharge 01/05/2024 1211 by Emil Roselie SAUNDERS, RN Outcome: Adequate for Discharge Goal: Individualized Educational Video(s) 01/05/2024 1211 by Emil Roselie SAUNDERS, RN Outcome: Adequate for Discharge 01/05/2024 1211 by  Emil Roselie SAUNDERS, RN Outcome: Adequate for Discharge   Problem: Activity: Goal: Ability to tolerate increased activity will improve 01/05/2024 1211 by Emil Roselie SAUNDERS, RN Outcome: Adequate for Discharge 01/05/2024 1211 by Emil Roselie SAUNDERS, RN Outcome: Adequate for Discharge  Problem: Cardiac: Goal: Ability to achieve and maintain adequate cardiovascular perfusion will improve 01/05/2024 1211 by Emil Roselie SAUNDERS, RN Outcome: Adequate for Discharge 01/05/2024 1211 by Emil Roselie SAUNDERS, RN Outcome: Adequate for Discharge   Problem: Health Behavior/Discharge Planning: Goal: Ability to safely manage health-related needs after discharge will improve 01/05/2024 1211 by Emil Roselie SAUNDERS, RN Outcome: Adequate for Discharge 01/05/2024 1211 by Emil Roselie SAUNDERS, RN Outcome: Adequate for Discharge   Problem: Education: Goal: Understanding of CV disease, CV risk reduction, and recovery process will improve 01/05/2024 1211 by Emil Roselie SAUNDERS, RN Outcome: Adequate for Discharge 01/05/2024 1211 by Emil Roselie SAUNDERS, RN Outcome: Adequate for Discharge Goal: Individualized Educational Video(s) 01/05/2024 1211 by Emil Roselie SAUNDERS, RN Outcome: Adequate for Discharge 01/05/2024 1211 by Emil Roselie SAUNDERS, RN Outcome: Adequate for Discharge   Problem: Activity: Goal: Ability to return to baseline activity level will improve 01/05/2024 1211 by Emil Roselie SAUNDERS, RN Outcome: Adequate for Discharge 01/05/2024 1211 by Emil Roselie SAUNDERS, RN Outcome: Adequate for Discharge   Problem: Cardiovascular: Goal: Ability to achieve and maintain adequate cardiovascular perfusion will improve 01/05/2024 1211 by Emil Roselie SAUNDERS, RN Outcome: Adequate for Discharge 01/05/2024 1211 by Emil Roselie SAUNDERS, RN Outcome: Adequate for Discharge Goal: Vascular access site(s) Level 0-1 will be maintained 01/05/2024 1211 by Emil Roselie SAUNDERS, RN Outcome: Adequate for Discharge 01/05/2024 1211 by Emil Roselie SAUNDERS, RN Outcome: Adequate for Discharge   Problem: Health Behavior/Discharge Planning: Goal: Ability to safely manage health-related needs after discharge will improve 01/05/2024 1211 by Emil Roselie SAUNDERS, RN Outcome: Adequate for Discharge 01/05/2024 1211 by Emil Roselie SAUNDERS, RN Outcome: Adequate for Discharge   Problem: Education: Goal: Ability to describe self-care measures that may prevent or decrease complications (Diabetes Survival Skills Education) will improve 01/05/2024 1211 by Emil Roselie SAUNDERS, RN Outcome: Adequate for Discharge 01/05/2024 1211 by Emil Roselie SAUNDERS, RN Outcome: Adequate for Discharge Goal: Individualized Educational Video(s) 01/05/2024 1211 by Emil Roselie SAUNDERS, RN Outcome: Adequate for Discharge 01/05/2024 1211 by Emil Roselie SAUNDERS, RN Outcome: Adequate for Discharge   Problem: Coping: Goal: Ability to adjust to condition or change in health will improve 01/05/2024 1211 by Emil Roselie SAUNDERS, RN Outcome: Adequate for Discharge 01/05/2024 1211 by Emil Roselie SAUNDERS, RN Outcome: Adequate for Discharge   Problem: Fluid Volume: Goal: Ability to maintain a balanced intake and output will improve 01/05/2024 1211 by Emil Roselie SAUNDERS, RN Outcome: Adequate for Discharge 01/05/2024 1211 by Emil Roselie SAUNDERS, RN Outcome: Adequate for Discharge   Problem: Health Behavior/Discharge Planning: Goal: Ability to identify and utilize available resources and services will improve 01/05/2024 1211 by Emil Roselie SAUNDERS, RN Outcome: Adequate for Discharge 01/05/2024 1211 by Emil Roselie SAUNDERS, RN Outcome: Adequate for Discharge Goal: Ability to manage health-related needs will improve 01/05/2024 1211 by Emil Roselie SAUNDERS, RN Outcome: Adequate for Discharge 01/05/2024 1211 by Emil Roselie SAUNDERS, RN Outcome: Adequate for Discharge   Problem: Metabolic: Goal: Ability to maintain appropriate glucose levels will improve 01/05/2024 1211 by Emil Roselie SAUNDERS,  RN Outcome: Adequate for Discharge 01/05/2024 1211 by Emil Roselie SAUNDERS, RN Outcome: Adequate for Discharge   Problem: Nutritional: Goal: Maintenance of adequate nutrition will improve 01/05/2024 1211 by Emil Roselie SAUNDERS, RN Outcome: Adequate for Discharge 01/05/2024 1211 by Emil Roselie SAUNDERS, RN Outcome: Adequate for Discharge Goal: Progress toward achieving an optimal weight will improve 01/05/2024 1211 by Emil Roselie SAUNDERS, RN Outcome: Adequate for Discharge 01/05/2024 1211 by Emil Roselie SAUNDERS, RN Outcome: Adequate for Discharge  Problem: Skin Integrity: Goal: Risk for impaired skin integrity will decrease 01/05/2024 1211 by Emil Roselie SAUNDERS, RN Outcome: Adequate for Discharge 01/05/2024 1211 by Emil Roselie SAUNDERS, RN Outcome: Adequate for Discharge   Problem: Tissue Perfusion: Goal: Adequacy of tissue perfusion will improve 01/05/2024 1211 by Emil Roselie SAUNDERS, RN Outcome: Adequate for Discharge 01/05/2024 1211 by Emil Roselie SAUNDERS, RN Outcome: Adequate for Discharge

## 2024-01-05 NOTE — Discharge Summary (Signed)
 Discharge Summary   Patient ID: Joseph Hess MRN: [id number]; DOB: [birth year]  Admit date: 01/02/2024 Discharge date: 01/05/2024  PCP:  Loreli Elyn SAILOR, MD   [facility] Providers Cardiologist:  None       Discharge Diagnoses  Principal Problem:   STEMI (ST elevation myocardial infarction) [facility]) Active Problems:   CAD (coronary artery disease)   Substance use   Acute HFrEF (heart failure with reduced ejection fraction) (HCC)   Essential hypertension   DM2 (diabetes mellitus, type 2) (HCC)   Diagnostic Studies/Procedures   Cardiac Catheterization 01/02/24 Hemodynamic data: LVEDP 19 mmHg.  Ao 137/92, mean 113 mmHg.  No pressure gradient across the aortic valve.   Angiographic data: LM: Large-caliber vessel, smooth and normal. LAD: Large vessel, gives origin to several small diagonals. RI: Large-caliber vessel, mildly tortuous in the proximal segment.  Mid segment has a smooth 40% stenosis. Cx: Moderate caliber vessel giving origin to very tiny marginals.  Smooth and normal. RCA: Very large caliber vessel and a dominant vessel giving origin to very large PDA and PL branches.  It is occluded in the proximal segment after giving a proximal large RV branch.   Interventional data: Successful stenting of the proximal RCA with implantation of a 3.0 x 20 mm Synergy XD DES postdilated with a 3.25 mm [place] balloon at 16 atmospheric pressure.  Stenosis reduced from 100% to 0% with TIMI 0 to TIMI-3 flow.  IC verapamil  had to be utilized in view of significant low-flow phenomena due to relatively late presentation.      Impression and recommendations: DAPT with aspirin  and prasugrel  for 1 year.  He was started on cangrelor , the drip will be completed.  2D echocardiogram 01/02/24 1. Left ventricular ejection fraction, by estimation, is 25 to 30%. The  left ventricle has severely decreased function. The left ventricle  demonstrates global hypokinesis. Left ventricular diastolic  parameters are  consistent with Grade I diastolic  dysfunction (impaired relaxation).   2. Right ventricular systolic function is mildly reduced. The right  ventricular size is mildly enlarged. There is normal pulmonary artery  systolic pressure. The estimated right ventricular systolic pressure is  33.4 mmHg.   3. The mitral valve is grossly normal. No evidence of mitral valve  regurgitation. No evidence of mitral stenosis.   4. The aortic valve is tricuspid. Aortic valve regurgitation is not  visualized. No aortic stenosis is present.   5. The inferior vena cava is normal in size with <50% respiratory  variability, suggesting right atrial pressure of 8 mmHg.   _____________   History of Present Illness   Joseph Hess is a 58 y.o. male with type 2 DM, substance use (tobacco, ETOH, marijuana) who presented to [facility] with chest pain. He began to experience discomfort on 8/13 which he thought was indigestion but symptoms became more progressive and frequent. Overnight this worsened so came into the ED the AM of 8/14, found to have inferior STEMI and taken emergently to the cath lab.   Hospital Course    1. CAD with Inferior STEMI - LHC w/ severe single vessel occlusive disease, 100% mRCA s/p PCI. LAD no disease, RI w/ 40% prox stenosis  - Started on standard post-MI therapy with DAPT w/ ASA + Effient , atorvastatin  80 mg daily, carvedilol  3.125 mg BID - referred to cardiac rehab   2. 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 CAD per above but CM out  of proportion to degree of CAD, suspect ETOH could be contributing  - Volume ok  - Insurance will not cover Entresto  so started on losartan , spironolactone , Jardiance , carvedilol    3. HTN - improved with therapy above   4. Substance Use - Tobacco and ETOH - advised to quit    5. T2DM - Hgb A1c 6.9 - on SSI + statin  - Jardiance  added (received coupon for  first month, will need to assess insurance coverage at follow-up) - Recommended OP PCP follow-up  Dr. Zenaida has seen and examined the patient today and feels he is stable for discharge. F/u has been arranged by AHF team 01/14/24. He was advised to remain out of work until cleared by his cardiologist. Work note given. Meds sent to [facility] pharmacy Saturday for provision at time of Sunday discharge..     Did the patient have an acute coronary syndrome (MI, NSTEMI, STEMI, etc) this admission?:  Yes                               AHA/ACC ACS Clinical Performance & Quality Measures: Aspirin  prescribed? - Yes ADP Receptor Inhibitor (Plavix/Clopidogrel, Brilinta/Ticagrelor or Effient /Prasugrel ) prescribed (includes medically managed patients)? - Yes Beta Blocker prescribed? - Yes High Intensity Statin (Lipitor 40-80mg  or Crestor 20-40mg ) prescribed? - Yes EF assessed during THIS hospitalization? - Yes For EF <40%, was ACEI/ARB prescribed? - Yes For EF <40%, Aldosterone Antagonist (Spironolactone  or Eplerenone) prescribed? - Yes Cardiac Rehab Phase II ordered (including medically managed patients)? - Yes   _____________  Discharge Vitals Blood pressure 130/86, pulse 85, temperature 98.2 F (36.8 C), temperature source Oral, resp. rate 18, height 5' 11 (1.803 m), weight 57.7 kg, SpO2 98%.  Filed Weights   01/02/24 0508 01/03/24 1700  Weight: 63.5 kg 57.7 kg    Labs & Radiologic Studies  CBC Recent Labs    01/04/24 0455 01/05/24 0438  WBC 7.9 7.9  HGB 13.5 14.0  HCT 41.7 42.8  MCV 83.9 83.9  PLT 297 298   Basic Metabolic Panel Recent Labs    91/83/74 0455 01/05/24 0438  NA 134* 134*  K 3.8 4.2  CL 105 107  CO2 21* 21*  GLUCOSE 144* 114*  BUN 24* 19  CREATININE 0.99 0.78  CALCIUM  8.7* 8.7*  MG 2.0 2.2   High Sensitivity Troponin:   Recent Labs  Lab 01/02/24 0515 01/02/24 0829  TROPONINIHS 11,580* >24,000*    Lipoprotein (a)  Date/Time Value Ref Range Status   01/02/2024 11:35 AM 138.7 (H) <75.0 nmol/L Final    Comment:    (NOTE) This test was developed and its performance characteristics determined by [facility]. It has not been cleared or approved by the Food and Drug Administration. Note:  Values greater than or equal to 75.0 nmol/L may       indicate an independent risk factor for CHD,       but must be evaluated with caution when applied       to non-Caucasian populations due to the       influence of genetic factors on Lp(a) across       ethnicities. Performed At: [facility] [address] [place], [place] [id number] Jennette Shorter MD Ey:[id number]     Thyroid Function Tests No results for input(s): TSH, T4TOTAL, T3FREE, THYROIDAB in the last 72 hours.  Invalid input(s): FREET3  _____________  ECHOCARDIOGRAM COMPLETE Result Date: 01/02/2024    ECHOCARDIOGRAM REPORT  Patient Name:   Joseph Hess Date of Exam: 01/02/2024 Medical Rec #:  [id number]       Height:       71.0 in Accession #:    [id number]      Weight:       140.0 lb Date of Birth:  28-Jun-1965      BSA:          1.812 m Patient Age:    57 years        BP:           130/91 mmHg Patient Gender: M               HR:           64 bpm. Exam Location:  Inpatient Procedure: 2D Echo (Both Spectral and Color Flow Doppler were utilized during            procedure). Indications:    acute myocardial infarction  History:        Patient has no prior history of Echocardiogram examinations.                 CAD; Risk Factors:Current Smoker.  Sonographer:    Tinnie Barefoot RDCS Referring Phys: [id number] GORDY BERGAMO IMPRESSIONS  1. Left ventricular ejection fraction, by estimation, is 25 to 30%. The left ventricle has severely decreased function. The left ventricle demonstrates global hypokinesis. Left ventricular diastolic parameters are consistent with Grade I diastolic dysfunction (impaired relaxation).  2. Right ventricular systolic function is mildly reduced. The right  ventricular size is mildly enlarged. There is normal pulmonary artery systolic pressure. The estimated right ventricular systolic pressure is 33.4 mmHg.  3. The mitral valve is grossly normal. No evidence of mitral valve regurgitation. No evidence of mitral stenosis.  4. The aortic valve is tricuspid. Aortic valve regurgitation is not visualized. No aortic stenosis is present.  5. The inferior vena cava is normal in size with <50% respiratory variability, suggesting right atrial pressure of 8 mmHg. FINDINGS  Left Ventricle: Left ventricular ejection fraction, by estimation, is 25 to 30%. The left ventricle has severely decreased function. The left ventricle demonstrates global hypokinesis. The left ventricular internal cavity size was normal in size. There is no left ventricular hypertrophy. Left ventricular diastolic parameters are consistent with Grade I diastolic dysfunction (impaired relaxation). Right Ventricle: The right ventricular size is mildly enlarged. No increase in right ventricular wall thickness. Right ventricular systolic function is mildly reduced. There is normal pulmonary artery systolic pressure. The tricuspid regurgitant velocity  is 2.52 m/s, and with an assumed right atrial pressure of 8 mmHg, the estimated right ventricular systolic pressure is 33.4 mmHg. Left Atrium: Left atrial size was normal in size. Right Atrium: Right atrial size was normal in size. Pericardium: There is no evidence of pericardial effusion. Mitral Valve: The mitral valve is grossly normal. No evidence of mitral valve regurgitation. No evidence of mitral valve stenosis. Tricuspid Valve: The tricuspid valve is grossly normal. Tricuspid valve regurgitation is trivial. No evidence of tricuspid stenosis. Aortic Valve: The aortic valve is tricuspid. Aortic valve regurgitation is not visualized. No aortic stenosis is present. Pulmonic Valve: The pulmonic valve was grossly normal. Pulmonic valve regurgitation is not visualized.  No evidence of pulmonic stenosis. Aorta: The aortic root is normal in size and structure. Venous: The inferior vena cava is normal in size with less than 50% respiratory variability, suggesting right atrial pressure of 8 mmHg. IAS/Shunts: The atrial septum is grossly normal.  LEFT VENTRICLE PLAX 2D LVIDd:         5.00 cm LVIDs:         4.10 cm LV PW:         0.80 cm LV IVS:        0.80 cm LVOT diam:     2.00 cm LVOT Area:     3.14 cm  LV Volumes (MOD) LV vol d, MOD A2C: 88.2 ml LV vol d, MOD A4C: 88.7 ml LV vol s, MOD A2C: 62.0 ml LV vol s, MOD A4C: 55.4 ml LV SV MOD A2C:     26.2 ml LV SV MOD A4C:     88.7 ml LV SV MOD BP:      29.6 ml RIGHT VENTRICLE            IVC RV Basal diam:  2.90 cm    IVC diam: 1.80 cm RV S prime:     7.30 cm/s TAPSE (M-mode): 1.1 cm LEFT ATRIUM             Index        RIGHT ATRIUM           Index LA diam:        3.10 cm 1.71 cm/m   RA Area:     15.70 cm LA Vol (A2C):   41.3 ml 22.79 ml/m  RA Volume:   43.30 ml  23.90 ml/m LA Vol (A4C):   30.4 ml 16.78 ml/m LA Biplane Vol: 36.8 ml 20.31 ml/m   AORTA Ao Root diam: 3.20 cm MITRAL VALVE               TRICUSPID VALVE MV Area (PHT): 3.03 cm    TR Peak grad:   25.4 mmHg MV Decel Time: 250 msec    TR Vmax:        252.00 cm/s MV E velocity: 43.90 cm/s MV A velocity: 56.00 cm/s  SHUNTS MV E/A ratio:  0.78        Systemic Diam: 2.00 cm Darryle Decent MD Electronically signed by Darryle Decent MD Signature Date/Time: 01/02/2024/3:16:06 PM    Final    DG Chest Port 1 View Result Date: 01/02/2024 EXAM: 1 VIEW XRAY OF THE CHEST 01/02/2024 07:21:00 AM COMPARISON: 12/23/2018 CLINICAL HISTORY: CP. Reason for exam: Chest Pain FINDINGS: LUNGS AND PLEURA: Lungs appear hyperinflated with diffuse coarsened interstitial markings. No airspace consolidation. No pleural effusion. No pneumothorax. HEART AND MEDIASTINUM: Heart size and mediastinal contours are normal. No acute abnormality of the cardiac and mediastinal silhouettes. BONES AND SOFT TISSUES: No  acute osseous abnormality. Calcifications identified within the upper abdomen which are favored to represent sequelae of chronic pancreatitis. IMPRESSION: 1. No acute cardiopulmonary pathology. 2. Hyperinflated lungs with diffuse coarsened interstitial markings. 3. Calcifications in the upper abdomen, favored to represent sequelae of chronic pancreatitis. Electronically signed by: Waddell Calk MD 01/02/2024 07:29 AM EDT RP Workstation: [id number]   CARDIAC CATHETERIZATION Result Date: 01/02/2024 Images from the original result were not included. Cardiac Catheterization 01/02/24: Hemodynamic data: LVEDP 19 mmHg.  Ao 137/92, mean 113 mmHg.  No pressure gradient across the aortic valve. Angiographic data: LM: Large-caliber vessel, smooth and normal. LAD: Large vessel, gives origin to several small diagonals. RI: Large-caliber vessel, mildly tortuous in the proximal segment.  Mid segment has a smooth 40% stenosis. Cx: Moderate caliber vessel giving origin to very tiny marginals.  Smooth and normal. RCA: Very large caliber vessel and a dominant vessel giving origin to very large PDA  and PL branches.  It is occluded in the proximal segment after giving a proximal large RV branch. Interventional data: Successful stenting of the proximal RCA with implantation of a 3.0 x 20 mm Synergy XD DES postdilated with a 3.25 mm [place] balloon at 16 atmospheric pressure.  Stenosis reduced from 100% to 0% with TIMI 0 to TIMI-3 flow.  IC verapamil  had to be utilized in view of significant low-flow phenomena due to relatively late presentation. Impression and recommendations: DAPT with aspirin  and prasugrel  for 1 year.  He was started on cangrelor , the drip will be completed.    Disposition Pt is being discharged home today in good condition.  Follow-up Plans & Appointments  Follow-up Information     Zenaida Morene PARAS, MD Follow up.   Specialty: Cardiology Why: Hospital Follow Up in the Advanced Heart Failure Clinic at [facility], Entrance C (Women and Children's Entrance)  01/14/24 at 9:20 AM Contact information: [address], [address] [place] [address] [phone]                Discharge Instructions     (HEART FAILURE PATIENTS) Call MD:  Anytime you have any of the following symptoms: 1) 3 pound weight gain in 24 hours or 5 pounds in 1 week 2) shortness of breath, with or without a dry hacking cough 3) swelling in the hands, feet or stomach 4) if you have to sleep on extra pillows at night in order to breathe.   Complete by: As directed    Amb Referral to Cardiac Rehabilitation   Complete by: As directed    Diagnosis:  Coronary Stents STEMI     After initial evaluation and assessments completed: Virtual Based Care may be provided alone or in conjunction with Phase 2 Cardiac Rehab based on patient barriers.: Yes   Intensive Cardiac Rehabilitation (ICR) [facility] location only OR Traditional Cardiac Rehabilitation (TCR) *If criteria for ICR are not met will enroll in TCR [facility] only): Yes   Call MD for:  redness, tenderness, or signs of infection (pain, swelling, redness, odor or green/yellow discharge around incision site)   Complete by: As directed    Diet - low sodium heart healthy   Complete by: As directed    Increase activity slowly   Complete by: As directed    No driving for 2 weeks. No lifting over 10 lbs for 4 weeks. No sexual activity for 4 weeks. You may not return to work until cleared by your cardiologist. Keep procedure site clean & dry. If you notice increased pain, swelling, bleeding or pus, call/return!  You may shower, but no soaking baths/hot tubs/pools for 1 week.       Discharge Medications Allergies as of 01/05/2024       Reactions   Penicillins Other (See Comments)   Unknown reaction        Medication List     TAKE these medications    aspirin  EC 81 MG tablet Take 1 tablet (81 mg total) by mouth daily. Swallow whole.   atorvastatin  80 MG  tablet Commonly known as: LIPITOR Take 1 tablet (80 mg total) by mouth daily.   carvedilol  3.125 MG tablet Commonly known as: COREG  Take 1 tablet (3.125 mg total) by mouth 2 (two) times daily with a meal.   Jardiance  10 MG Tabs tablet Generic drug: empagliflozin  Take 1 tablet (10 mg total) by mouth daily.   losartan  50 MG tablet Commonly known as: COZAAR  Take 1 tablet (  50 mg total) by mouth daily.   nitroGLYCERIN  0.4 MG SL tablet Commonly known as: NITROSTAT  Place 1 tablet (0.4 mg total) under the tongue every 5 (five) minutes x 3 doses as needed for chest pain.   prasugrel  10 MG Tabs tablet Commonly known as: EFFIENT  Take 1 tablet (10 mg total) by mouth daily.   spironolactone  25 MG tablet Commonly known as: ALDACTONE  Take 1 tablet (25 mg total) by mouth daily.         Outstanding Labs/Studies N/A - will need standard post-hospital lab f/u given new initiation of GDMT to be ordered at f/u visit.  Duration of Discharge Encounter: APP Time: 15 minutes   Signed, Linda Biehn N Jeniece Hannis, PA-C 01/05/2024, 12:10 PM

## 2024-01-13 ENCOUNTER — Telehealth (HOSPITAL_COMMUNITY): Payer: Self-pay | Admitting: Cardiology

## 2024-01-13 NOTE — Telephone Encounter (Signed)
 Called to confirm/remind patient of their appointment at the [facility] Clinic on 01/13/2024.   Appointment:   [] Confirmed  [x] Left mess   [] No answer/No voice mail  [] VM Full/unable to leave message  [] Phone not in service  Patient reminded to bring all medications and/or complete list.  Confirmed patient has transportation. Gave directions, instructed to utilize valet parking.

## 2024-01-14 ENCOUNTER — Encounter (HOSPITAL_COMMUNITY): Payer: Self-pay | Admitting: Cardiology

## 2024-01-14 ENCOUNTER — Ambulatory Visit (HOSPITAL_COMMUNITY)
Admit: 2024-01-14 | Discharge: 2024-01-14 | Disposition: A | Discharge status: Home / Self Care | Payer: Self-pay | Source: Ambulatory Visit | Attending: Cardiology | Admitting: Cardiology

## 2024-01-14 VITALS — BP 130/70 | HR 77 | Wt 129.0 lb

## 2024-01-14 DIAGNOSIS — Z87891 Personal history of nicotine dependence: Secondary | ICD-10-CM | POA: Insufficient documentation

## 2024-01-14 DIAGNOSIS — Z7982 Long term (current) use of aspirin: Secondary | ICD-10-CM | POA: Diagnosis not present

## 2024-01-14 DIAGNOSIS — I2111 ST elevation (STEMI) myocardial infarction involving right coronary artery: Secondary | ICD-10-CM | POA: Insufficient documentation

## 2024-01-14 DIAGNOSIS — I5022 Chronic systolic (congestive) heart failure: Secondary | ICD-10-CM | POA: Diagnosis present

## 2024-01-14 DIAGNOSIS — Z79899 Other long term (current) drug therapy: Secondary | ICD-10-CM | POA: Diagnosis not present

## 2024-01-14 DIAGNOSIS — I5021 Acute systolic (congestive) heart failure: Secondary | ICD-10-CM

## 2024-01-14 DIAGNOSIS — Z955 Presence of coronary angioplasty implant and graft: Secondary | ICD-10-CM | POA: Diagnosis not present

## 2024-01-14 DIAGNOSIS — F1091 Alcohol use, unspecified, in remission: Secondary | ICD-10-CM | POA: Insufficient documentation

## 2024-01-14 MED ORDER — DIGOXIN 125 MCG PO TABS: 0.1250 mg | ORAL_TABLET | Freq: Every day | ORAL | 3 refills | Status: DC

## 2024-01-14 NOTE — Patient Instructions (Signed)
 STOP Jardiance .  START Digoxin  125 mcg daily.  Your physician recommends that you schedule a follow-up appointment as scheduled.  If you have any questions or concerns before your next appointment please send us  a message through [place] or call our office at [phone].    TO LEAVE A MESSAGE FOR THE NURSE SELECT OPTION 2, PLEASE LEAVE A MESSAGE INCLUDING: YOUR NAME DATE OF BIRTH CALL BACK NUMBER REASON FOR CALL**this is important as we prioritize the call backs  YOU WILL RECEIVE A CALL BACK THE SAME DAY AS LONG AS YOU CALL BEFORE 4:00 PM  At the [facility], you and your health needs are our priority. As part of our continuing mission to provide you with exceptional heart care, we have created designated Provider Care Teams. These Care Teams include your primary Cardiologist (physician) and Advanced Practice Providers (APPs- Physician Assistants and Nurse Practitioners) who all work together to provide you with the care you need, when you need it.   You may see any of the following providers on your designated Care Team at your next follow up: Dr Toribio Fuel Dr Ezra Shuck Dr. Ria Commander Dr. [name] Amy Lenetta, NP Caffie Shed, [place] [facility] [place], [place] Beckey Coe, NP [place] Lee, NP Ellouise Class, NP Tinnie Redman, PharmD Jaun Bash, PharmD   Please be sure to bring in all your medications bottles to every appointment.    Thank you for choosing [facility]

## 2024-01-18 NOTE — Progress Notes (Signed)
   ADVANCED HEART FAILURE FOLLOW UP CLINIC NOTE  Referring Physician: Loreli Elyn SAILOR, MD  Primary Care: Loreli Elyn SAILOR, MD Primary Cardiologist:  HPI: Joseph Hess is a 58 y.o. male who presents for follow up of chronic systolic heart failure and recent inferior STEMI.      Presented on 01/02/24 with chest discomfort and found to have an inferior STEMI. Underwent successful PCI to the RCA, mild nonobstructive disease otherwise. Very large, dominant RCA. Resulting EF <30%, patient was started on low dose GDMT and discharged with close follow up.       SUBJECTIVE:  Patient reports that overall he is doing fair. He has been much more fatigued since discharge, getting tired doing basic things like walking that he used to be able to do easily. Notes some dizziness as well with standing and getting up. No significant swelling or chest discomfort.   PMH, current medications, allergies, social history, and family history reviewed in epic.  PHYSICAL EXAM: Vitals:   01/14/24 0937  BP: 130/70  Pulse: 77  SpO2: 100%   GENERAL: Well nourished and in no apparent distress at rest.  PULM:  Normal work of breathing, clear to auscultation bilaterally. Respirations are unlabored.  CARDIAC:  JVP: flat         Normal rate with regular rhythm. No murmurs, rubs or gallops.  No edema. Warm and well perfused extremities. ABDOMEN: Soft, non-tender, non-distended. NEUROLOGIC: Patient is oriented x3 with no focal or lateralizing neurologic deficits.    DATA REVIEW  ECG: 01/03/2024: NSR with PACs, inferior infarct    ECHO: 01/02/2024: LVEF 25-30%, global hypokinesis, RV mildly reduced, valves fine   CATH: 01/02/2024: RCA 100% occluded, relatively poor flow, 40% RI     ASSESSMENT & PLAN:  Chronic systolic heart failure: Suspected ischemic, but global LV dysfunction out of proportion to RCA infarct suggests some may be do to history of heavy alcohol use. NYHA class III currently, mainly limited by  fatigue. - Appears euvolemic to dry - Stop jardiance  given orthostatic symptoms - Start digoxin  125mcg for RV support - Continue carvedilol  3.125mg  BID - Continue losartan  50mg  daily, entresto  not covered - Continue spironolactone  25mg  daily  CAD: Recent RCA STEMI - Continue aspirin , prasgurel 10mg  daily - Continue atorvastatin  80mg  daily - Cardiac rehab  Alcohol use: Has stopped using alcohol and tobacco, occasional marijuana use - Congratulated  Follow up in 2 months  [name], MD Advanced Heart Failure Mechanical Circulatory Support 01/18/24

## 2024-01-31 NOTE — Progress Notes (Signed)
 ADVANCED HEART FAILURE FOLLOW UP CLINIC NOTE   Primary Care: Loreli Elyn SAILOR, MD Primary Cardiologist: Dr. Zenaida  HPI: Joseph Hess is a 58 y.o. male who presents for follow up of chronic systolic heart failure and recent inferior STEMI.      Admitted on 01/02/24 with chest discomfort and found to have an inferior STEMI. Underwent successful PCI to the RCA, mild nonobstructive disease otherwise. Very large, dominant RCA. Resulting EF <30%, patient was started on low dose GDMT and discharged with close follow up.       SUBJECTIVE:  Patient reports that overall he is doing fair. He has been much more fatigued since discharge, getting tired doing basic things like walking that he used to be able to do easily. Notes some dizziness as well with standing and getting up. No significant swelling or chest discomfort.   Today he returns for HF follow up with his wife. Overall feeling fair. He has SOB walking on flat ground. Remains dizzy, despite stopping jardiance . Has not been able to get digoxin  yet, pharmacy did not have med. Had an episode last week of chest pain with dizziness, occurred while working on a fence. He attributed this to a panic attack. He took 3 nitroglycerins. No events since. Denies palpitations, abnormal bleeding, edema, or PND/Orthopnea. Appetite ok. Weight at home 129-130 pounds. Taking all medications. Smokes 2-3 cigs/day, smokes THC all day, drinks 1-2 shots/day.  PMH, current medications, allergies, social history, and family history reviewed in epic.  Wt Readings from Last 3 Encounters:  02/04/24 58.1 kg (128 lb)  01/14/24 58.5 kg (129 lb)  01/03/24 57.7 kg (127 lb 3.3 oz)   BP 138/72   Pulse 67   Ht 5' 11 (1.803 m)   Wt 58.1 kg (128 lb)   SpO2 99%   BMI 17.85 kg/m   PHYSICAL EXAM: General:  NAD. No resp difficulty, walked into clinic, thin HEENT: Normal Neck: Supple. No JVD. Cor: Irregular rate & rhythm (PVCs). No rubs, gallops or murmurs. Lungs:  Clear Abdomen: Soft, nontender, nondistended.  Extremities: No cyanosis, clubbing, rash, edema Neuro: Alert & oriented x 3, moves all 4 extremities w/o difficulty. Affect pleasant.  ReDs reading: 25 %, normal  DATA REVIEW  ECG: 01/03/2024: NSR with PACs, inferior infarct   02/04/24: NSR with frequent PVCs (personally reviewed)  ECHO: 01/02/2024: LVEF 25-30%, global hypokinesis, RV mildly reduced, valves fine   CATH: 01/02/2024: RCA 100% occluded, relatively poor flow, 40% RI   ASSESSMENT & PLAN:  Chronic systolic heart failure: Suspected ischemic, but global LV dysfunction out of proportion to RCA infarct suggests some may be do to history of heavy alcohol use. NYHA IIb-III, volume OK today. ReDs 25% - Restart digoxin  0.125 mg daily for RV support - Remain off Jardiance  with orthostasis - Continue carvedilol  3.125 mg bid - Continue losartan  50 mg daily (entresto  not covered) - Continue spironolactone  25 mg daily - Labs today, check digoxin  trough next visit - Repeat echo in 2 months  CAD: Recent RCA STEMI - Had episode of CP last week, took nitroglycerin . He thinks it was a panic attack. ECG today with PVCs, no acute ST-T changes - Continue aspirin  + prasgurel 10 mg daily - Continue atorvastatin  80 mg daily - Refer to Cardiac rehab  PVCs: asymptomatic - Place 2 week Zio to quantify burden - Check CBC, iron panel and TSH today - Needs to cut back on ETOH and THC - Discuss sleep study next visit  Polysubstance abuse -  Smoking 2-3 cigs/day - Drinking 1-2 shots/day - Smokes THC daily  - Has cut back considerably from discharge, we discussed importance of cessation  HTN - BP ok today, but has not had AM meds - Has had orthostasis in the past - I asked him to check BP daily and log - Meds as above - Labs today  Follow up in 3 weeks with APP and 2 months with Dr. Zenaida + echo.  Harlene Gainer, FNP-BC Advanced Heart Failure 02/04/24

## 2024-02-03 ENCOUNTER — Telehealth (HOSPITAL_COMMUNITY): Payer: Self-pay | Admitting: *Deleted

## 2024-02-03 NOTE — Telephone Encounter (Signed)
 Called to confirm/remind patient of their appointment at the [facility] on  02/04/24.      Appointment:              [] Confirmed             [x] Left mess              [] No answer/No voice mail             [] Phone not in service   Patient reminded to bring all medications and/or complete list.   Confirmed patient has transportation. Gave directions, instructed to utilize valet parking.

## 2024-02-04 ENCOUNTER — Ambulatory Visit (HOSPITAL_COMMUNITY): Payer: Self-pay | Admitting: Family Medicine

## 2024-02-04 ENCOUNTER — Inpatient Hospital Stay (HOSPITAL_COMMUNITY)
Admission: RE | Admit: 2024-02-04 | Discharge: 2024-02-04 | Disposition: A | Discharge status: Home / Self Care | Source: Ambulatory Visit | Attending: Cardiology | Admitting: Cardiology

## 2024-02-04 ENCOUNTER — Encounter (HOSPITAL_COMMUNITY): Payer: Self-pay

## 2024-02-04 ENCOUNTER — Ambulatory Visit (HOSPITAL_COMMUNITY)
Admission: RE | Admit: 2024-02-04 | Discharge: 2024-02-04 | Disposition: A | Discharge status: Home / Self Care | Payer: Self-pay | Source: Ambulatory Visit | Attending: Family Medicine | Admitting: Family Medicine

## 2024-02-04 ENCOUNTER — Other Ambulatory Visit (HOSPITAL_COMMUNITY): Payer: Self-pay | Admitting: Cardiology

## 2024-02-04 VITALS — BP 138/72 | HR 67 | Ht 71.0 in | Wt 128.0 lb

## 2024-02-04 DIAGNOSIS — R0602 Shortness of breath: Secondary | ICD-10-CM | POA: Insufficient documentation

## 2024-02-04 DIAGNOSIS — F1210 Cannabis abuse, uncomplicated: Secondary | ICD-10-CM | POA: Insufficient documentation

## 2024-02-04 DIAGNOSIS — I2510 Atherosclerotic heart disease of native coronary artery without angina pectoris: Secondary | ICD-10-CM

## 2024-02-04 DIAGNOSIS — I493 Ventricular premature depolarization: Secondary | ICD-10-CM

## 2024-02-04 DIAGNOSIS — R5383 Other fatigue: Secondary | ICD-10-CM | POA: Diagnosis not present

## 2024-02-04 DIAGNOSIS — Z7902 Long term (current) use of antithrombotics/antiplatelets: Secondary | ICD-10-CM | POA: Insufficient documentation

## 2024-02-04 DIAGNOSIS — R42 Dizziness and giddiness: Secondary | ICD-10-CM | POA: Insufficient documentation

## 2024-02-04 DIAGNOSIS — Z7982 Long term (current) use of aspirin: Secondary | ICD-10-CM | POA: Diagnosis not present

## 2024-02-04 DIAGNOSIS — Z79899 Other long term (current) drug therapy: Secondary | ICD-10-CM | POA: Diagnosis not present

## 2024-02-04 DIAGNOSIS — I5022 Chronic systolic (congestive) heart failure: Secondary | ICD-10-CM | POA: Diagnosis present

## 2024-02-04 DIAGNOSIS — Z955 Presence of coronary angioplasty implant and graft: Secondary | ICD-10-CM | POA: Insufficient documentation

## 2024-02-04 DIAGNOSIS — F17210 Nicotine dependence, cigarettes, uncomplicated: Secondary | ICD-10-CM | POA: Insufficient documentation

## 2024-02-04 DIAGNOSIS — I252 Old myocardial infarction: Secondary | ICD-10-CM | POA: Diagnosis not present

## 2024-02-04 DIAGNOSIS — I10 Essential (primary) hypertension: Secondary | ICD-10-CM | POA: Diagnosis not present

## 2024-02-04 DIAGNOSIS — I110 Hypertensive heart disease with heart failure: Secondary | ICD-10-CM | POA: Diagnosis not present

## 2024-02-04 DIAGNOSIS — F1910 Other psychoactive substance abuse, uncomplicated: Secondary | ICD-10-CM

## 2024-02-04 LAB — CBC
HCT: 42.5 % (ref 39.0–52.0)
Hemoglobin: 13.6 g/dL (ref 13.0–17.0)
MCH: 27.1 pg (ref 26.0–34.0)
MCHC: 32 g/dL (ref 30.0–36.0)
MCV: 84.8 fL (ref 80.0–100.0)
Platelets: 299 K/uL (ref 150–400)
RBC: 5.01 MIL/uL (ref 4.22–5.81)
RDW: 14.3 % (ref 11.5–15.5)
WBC: 5.5 K/uL (ref 4.0–10.5)
nRBC: 0 % (ref 0.0–0.2)

## 2024-02-04 LAB — BASIC METABOLIC PANEL WITH GFR
Anion gap: 8 (ref 5–15)
BUN: 12 mg/dL (ref 6–20)
CO2: 21 mmol/L — ABNORMAL LOW (ref 22–32)
Calcium: 8.9 mg/dL (ref 8.9–10.3)
Chloride: 108 mmol/L (ref 98–111)
Creatinine, Ser: 1.2 mg/dL (ref 0.61–1.24)
GFR, Estimated: >60 mL/min (ref >60)
Glucose, Bld: 91 mg/dL (ref 70–99)
Potassium: 3.5 mmol/L (ref 3.5–5.1)
Sodium: 137 mmol/L (ref 135–145)

## 2024-02-04 LAB — TSH: TSH: 1.42 u[IU]/mL (ref 0.350–4.500)

## 2024-02-04 LAB — IRON AND TIBC
Iron: 75 ug/dL (ref 45–182)
Saturation Ratios: 24 % (ref 17.9–39.5)
TIBC: 315 ug/dL (ref 250–450)
UIBC: 240 ug/dL

## 2024-02-04 LAB — BRAIN NATRIURETIC PEPTIDE: B Natriuretic Peptide: 337.6 pg/mL — ABNORMAL HIGH (ref 0.0–100.0)

## 2024-02-04 LAB — FERRITIN: Ferritin: 68 ng/mL (ref 24–336)

## 2024-02-04 MED ORDER — DIGOXIN 125 MCG PO TABS: 0.1250 mg | ORAL_TABLET | Freq: Every day | ORAL | 3 refills | Status: DC

## 2024-02-04 NOTE — Progress Notes (Signed)
 ReDS Vest / Clip - 02/04/24 0908       ReDS Vest / Clip   Station Marker C    Ruler Value 28    ReDS Value Range Low volume    ReDS Actual Value 25

## 2024-02-04 NOTE — Progress Notes (Signed)
 FMLA paper work faxed to [employer] on 02/04/2024. Patient and spouse aware

## 2024-02-04 NOTE — Addendum Note (Signed)
 Encounter addended by: Marcelina Lisa HERO, RN on: 02/04/2024 11:28 AM  Actions taken: Clinical Note Signed

## 2024-02-04 NOTE — Patient Instructions (Signed)
 START Digoxin  125 mcg daily ( 1 Tab)  Labs done today, your results will be available in MyChart, we will contact you for abnormal readings.  Your provider has recommended that  you wear a Zio Patch for 14 days.  This monitor will record your heart rhythm for our review.  IF you have any symptoms while wearing the monitor please press the button.  If you have any issues with the patch or you notice a red or orange light on it please call the company at [phone].  Once you remove the patch please mail it back to the company as soon as possible so we can get the results.  Your physician has requested that you have an echocardiogram. Echocardiography is a painless test that uses sound waves to create images of your heart. It provides your doctor with information about the size and shape of your heart and how well your heart's chambers and valves are working. This procedure takes approximately one hour. There are no restrictions for this procedure. Please do NOT wear cologne, perfume, aftershave, or lotions (deodorant is allowed). Please arrive 15 minutes prior to your appointment time.  Please note: We ask at that you not bring children with you during ultrasound (echo/ vascular) testing. Due to room size and safety concerns, children are not allowed in the ultrasound rooms during exams. Our front office staff cannot provide observation of children in our lobby area while testing is being conducted. An adult accompanying a patient to their appointment will only be allowed in the ultrasound room at the discretion of the ultrasound technician under special circumstances. We apologize for any inconvenience.  You have been referred to cardiac rehab. They will call you to arrange your appointment.  PLEASE CHECK YOUR BLOOD PRESSURE AT HOME DAILY.  PLEASE HOLD YOUR DIGOXIN  PRIOR TO YOUR NEXT VISIT. Your physician recommends that you schedule a follow-up appointment in: AS SCHEDULED.

## 2024-02-11 ENCOUNTER — Other Ambulatory Visit (HOSPITAL_COMMUNITY): Payer: Self-pay

## 2024-02-19 ENCOUNTER — Other Ambulatory Visit (HOSPITAL_COMMUNITY): Payer: Self-pay | Admitting: Cardiology

## 2024-02-19 MED ORDER — SPIRONOLACTONE 25 MG PO TABS: 25.0000 mg | ORAL_TABLET | Freq: Every day | ORAL | 2 refills | Status: DC

## 2024-02-19 MED ORDER — PRASUGREL HCL 10 MG PO TABS: 10.0000 mg | ORAL_TABLET | Freq: Every day | ORAL | 3 refills | Status: DC

## 2024-02-19 MED ORDER — ATORVASTATIN CALCIUM 80 MG PO TABS: 80.0000 mg | ORAL_TABLET | Freq: Every day | ORAL | 2 refills | Status: DC

## 2024-02-19 MED ORDER — CARVEDILOL 3.125 MG PO TABS: 3.1250 mg | ORAL_TABLET | Freq: Two times a day (BID) | ORAL | 2 refills | Status: DC

## 2024-02-19 MED ORDER — LOSARTAN POTASSIUM 50 MG PO TABS: 50.0000 mg | ORAL_TABLET | Freq: Every day | ORAL | 2 refills | Status: DC

## 2024-02-19 MED ORDER — DIGOXIN 125 MCG PO TABS: 0.1250 mg | ORAL_TABLET | Freq: Every day | ORAL | 3 refills | Status: DC

## 2024-02-20 ENCOUNTER — Telehealth (HOSPITAL_COMMUNITY): Payer: Self-pay

## 2024-02-20 NOTE — Telephone Encounter (Signed)
 Office referral. Attempted to call patient regarding interest in cardiac rehab- no answer, left message. Sent MyChart message.  F/u 10/06.

## 2024-02-21 ENCOUNTER — Telehealth (HOSPITAL_COMMUNITY): Payer: Self-pay | Admitting: *Deleted

## 2024-02-21 NOTE — Telephone Encounter (Signed)
 Called to confirm/remind patient of their appointment at the [facility] on  02/24/24:       Appointment:              [x] Confirmed             [] Left mess              [] No answer/No voice mail             [] Phone not in service   Patient reminded to bring all medications and/or complete list.   Confirmed patient has transportation. Gave directions, instructed to utilize valet parking.

## 2024-02-24 ENCOUNTER — Telehealth (HOSPITAL_COMMUNITY): Payer: Self-pay | Admitting: Cardiology

## 2024-02-24 ENCOUNTER — Other Ambulatory Visit (HOSPITAL_COMMUNITY): Payer: Self-pay

## 2024-02-24 ENCOUNTER — Ambulatory Visit (HOSPITAL_COMMUNITY): Payer: Self-pay | Admitting: Family Medicine

## 2024-02-24 ENCOUNTER — Encounter (HOSPITAL_COMMUNITY): Payer: Self-pay

## 2024-02-24 ENCOUNTER — Other Ambulatory Visit: Payer: Self-pay

## 2024-02-24 ENCOUNTER — Ambulatory Visit (HOSPITAL_COMMUNITY)
Admission: RE | Admit: 2024-02-24 | Discharge: 2024-02-24 | Disposition: A | Discharge status: Home / Self Care | Source: Ambulatory Visit | Attending: Family Medicine | Admitting: Family Medicine

## 2024-02-24 DIAGNOSIS — I252 Old myocardial infarction: Secondary | ICD-10-CM | POA: Diagnosis not present

## 2024-02-24 DIAGNOSIS — I5022 Chronic systolic (congestive) heart failure: Secondary | ICD-10-CM | POA: Insufficient documentation

## 2024-02-24 DIAGNOSIS — I493 Ventricular premature depolarization: Secondary | ICD-10-CM | POA: Diagnosis not present

## 2024-02-24 DIAGNOSIS — Z79899 Other long term (current) drug therapy: Secondary | ICD-10-CM | POA: Insufficient documentation

## 2024-02-24 DIAGNOSIS — I10 Essential (primary) hypertension: Secondary | ICD-10-CM

## 2024-02-24 DIAGNOSIS — I2510 Atherosclerotic heart disease of native coronary artery without angina pectoris: Secondary | ICD-10-CM | POA: Insufficient documentation

## 2024-02-24 DIAGNOSIS — I110 Hypertensive heart disease with heart failure: Secondary | ICD-10-CM | POA: Insufficient documentation

## 2024-02-24 DIAGNOSIS — F1910 Other psychoactive substance abuse, uncomplicated: Secondary | ICD-10-CM | POA: Diagnosis not present

## 2024-02-24 DIAGNOSIS — F1290 Cannabis use, unspecified, uncomplicated: Secondary | ICD-10-CM | POA: Diagnosis not present

## 2024-02-24 DIAGNOSIS — Z955 Presence of coronary angioplasty implant and graft: Secondary | ICD-10-CM | POA: Insufficient documentation

## 2024-02-24 DIAGNOSIS — Z7982 Long term (current) use of aspirin: Secondary | ICD-10-CM | POA: Insufficient documentation

## 2024-02-24 DIAGNOSIS — F17210 Nicotine dependence, cigarettes, uncomplicated: Secondary | ICD-10-CM | POA: Diagnosis not present

## 2024-02-24 DIAGNOSIS — Z139 Encounter for screening, unspecified: Secondary | ICD-10-CM

## 2024-02-24 LAB — BASIC METABOLIC PANEL WITH GFR
Anion gap: 5 (ref 5–15)
BUN: 11 mg/dL (ref 6–20)
CO2: 25 mmol/L (ref 22–32)
Calcium: 9 mg/dL (ref 8.9–10.3)
Chloride: 111 mmol/L (ref 98–111)
Creatinine, Ser: 1.01 mg/dL (ref 0.61–1.24)
GFR, Estimated: >60 mL/min (ref >60)
Glucose, Bld: 85 mg/dL (ref 70–99)
Potassium: 4.1 mmol/L (ref 3.5–5.1)
Sodium: 141 mmol/L (ref 135–145)

## 2024-02-24 LAB — BRAIN NATRIURETIC PEPTIDE: B Natriuretic Peptide: 418.9 pg/mL — ABNORMAL HIGH (ref 0.0–100.0)

## 2024-02-24 MED ORDER — CARVEDILOL 3.125 MG PO TABS
3.1250 mg | ORAL_TABLET | Freq: Two times a day (BID) | ORAL | 2 refills | Status: DC
  Filled 2024-02-24 (×2): qty 60, 30d supply, fill #0

## 2024-02-24 MED ORDER — LOSARTAN POTASSIUM 50 MG PO TABS
50.0000 mg | ORAL_TABLET | Freq: Every day | ORAL | 2 refills | Status: DC
  Filled 2024-02-24 (×2): qty 30, 30d supply, fill #0

## 2024-02-24 MED ORDER — SPIRONOLACTONE 25 MG PO TABS: 25.0000 mg | ORAL_TABLET | Freq: Every day | ORAL | 2 refills | Status: DC

## 2024-02-24 MED ORDER — DIGOXIN 125 MCG PO TABS
0.1250 mg | ORAL_TABLET | Freq: Every day | ORAL | 3 refills | Status: DC
  Filled 2024-02-24: qty 90, 90d supply, fill #0

## 2024-02-24 MED ORDER — SPIRONOLACTONE 25 MG PO TABS
25.0000 mg | ORAL_TABLET | Freq: Every day | ORAL | 2 refills | Status: DC
  Filled 2024-02-24: qty 30, 30d supply, fill #0

## 2024-02-24 MED ORDER — ASPIRIN 81 MG PO TBEC
81.0000 mg | DELAYED_RELEASE_TABLET | Freq: Every day | ORAL | 3 refills | Status: DC
  Filled 2024-02-24: qty 90, 90d supply, fill #0

## 2024-02-24 MED ORDER — PRASUGREL HCL 10 MG PO TABS
10.0000 mg | ORAL_TABLET | Freq: Every day | ORAL | 3 refills | Status: DC
  Filled 2024-02-24: qty 90, 90d supply, fill #0

## 2024-02-24 MED ORDER — ATORVASTATIN CALCIUM 80 MG PO TABS
80.0000 mg | ORAL_TABLET | Freq: Every day | ORAL | 2 refills | Status: DC
  Filled 2024-02-24: qty 30, 30d supply, fill #0

## 2024-02-24 NOTE — Patient Instructions (Signed)
 Medication Changes:  RESTART MEDICATIONS--THESE HAVE BEEN SENT TO YOUR PHARMACY   Lab Work:  Labs done today, your results will be available in MyChart, we will contact you for abnormal readings.   THEN LABS AGAIN IN 1 WEEK AS SCHEDULED   Follow-Up in: AS SCHEDULED IN 1 MONTH   At the [facility], you and your health needs are our priority. We have a designated team specialized in the treatment of Heart Failure. This Care Team includes your primary Heart Failure Specialized Cardiologist (physician), Advanced Practice Providers (APPs- Physician Assistants and Nurse Practitioners), and Pharmacist who all work together to provide you with the care you need, when you need it.   You may see any of the following providers on your designated Care Team at your next follow up:  Dr. Toribio Fuel Dr. Ezra Shuck Dr. Ria Commander Dr. Odis Brownie Greig Mosses, NP Caffie Shed, [place] [facility] [place], [place] Beckey Coe, NP [place] Lee, NP Tinnie Redman, PharmD   Please be sure to bring in all your medications bottles to every appointment.   Need to Contact Us :  If you have any questions or concerns before your next appointment please send us  a message through [place] or call our office at [phone].    TO LEAVE A MESSAGE FOR THE NURSE SELECT OPTION 2, PLEASE LEAVE A MESSAGE INCLUDING: YOUR NAME DATE OF BIRTH CALL BACK NUMBER REASON FOR CALL**this is important as we prioritize the call backs  YOU WILL RECEIVE A CALL BACK THE SAME DAY AS LONG AS YOU CALL BEFORE 4:00 PM

## 2024-02-24 NOTE — Telephone Encounter (Signed)
 Patient called  Reports he was cleared to return to work at [employer]  however will need letter  [facility] to write letter? Any restrictions?

## 2024-02-24 NOTE — Progress Notes (Signed)
 ADVANCED HEART FAILURE FOLLOW UP CLINIC NOTE   Primary Care: Loreli Elyn SAILOR, MD Primary Cardiologist: Dr. Zenaida  HPI: Joseph Hess is a 58 y.o. male who presents for follow up of chronic systolic heart failure and recent inferior STEMI.      Admitted on 01/02/24 with chest discomfort and found to have an inferior STEMI. Underwent successful PCI to the RCA, mild nonobstructive disease otherwise. Very large, dominant RCA. Resulting EF <30%, patient was started on low dose GDMT and discharged with close follow up.       SUBJECTIVE:   Today he returns for HF follow up with his wife. He is SOB walking short distances on flat ground. Does OK with ADLs. Feels palpitations. Denies abnormal bleeding, CP, dizziness, edema, or PND/Orthopnea. Appetite ok. Weight at home 135-137 pounds. Does not know what meds he is taking, has been out of meds x 2-3 weeks. Has not picked up from pharmacy. Smokes 2-3 cigs/day, smokes THC all day, stopped drinking ETOH for past several weeks. 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  PMH, current medications, allergies, social history, and family history reviewed in epic.  Wt Readings from Last 3 Encounters:  02/24/24 62.1 kg (136 lb 12.8 oz)  02/04/24 58.1 kg (128 lb)  01/14/24 58.5 kg (129 lb)   BP (!) 166/98   Pulse 87   Wt 62.1 kg (136 lb 12.8 oz)   SpO2 99%   BMI 19.08 kg/m   PHYSICAL EXAM: General:  NAD. No resp difficulty, walked into clinic HEENT: Normal Neck: Supple. No JVD. Cor: Regular rate & rhythm. No rubs, gallops or murmurs. Lungs: Clear Abdomen: Soft, nontender, nondistended.  Extremities: No cyanosis, clubbing, rash, edema Neuro: Alert & oriented x 3, moves all 4 extremities w/o difficulty. Affect pleasant.  ReDs reading: 28%, normal  DATA REVIEW  ECG: 01/03/2024: NSR with PACs, inferior infarct   02/04/24: NSR with frequent PVCs   ECHO: 01/02/2024: LVEF 25-30%, global hypokinesis, RV mildly reduced, valves fine    CATH: 01/02/2024: RCA 100% occluded, relatively poor flow, 40% RI   ASSESSMENT & PLAN:  Chronic systolic heart failure: Suspected ischemic, but global LV dysfunction out of proportion to RCA infarct suggests some may be do to history of heavy alcohol use. NYHA IIb-III, volume OK today. ReDs 28% - Restart digoxin  0.125 mg daily for RV support - Restart carvedilol  3.125 mg bid - Restart losartan  50 mg daily (entresto  not covered) - Restart spironolactone  25 mg daily - Remain off Jardiance  with orthostasis - Labs today, check digoxin  trough next visit - Repeat echo next visit  CAD: Recent RCA STEMI - No chest pain - Restart aspirin  + prasgurel 10 mg daily - Restart atorvastatin  80 mg daily - Referred to Cardiac rehab  PVCs: asymptomatic - 2 week Zio placed last visit, results pending. - Discuss sleep study next visit  Polysubstance abuse - Smoking 2-3 cigs/day - Cut back on ETOH recently - Smokes THC daily  - Has cut back considerably from discharge, we discussed importance of cessation  HTN - BP elevated - Has had orthostasis in the past - I asked him to check BP daily and log - Restart meds as above - Labs today  SDOH: he has transportation and insurance. - Medication management is an issue. - Switch meds to [facility] pharmacy for delivery - Engage HFSW to help with financial resources while he is out of work  Follow up in 1 months with Dr. Zenaida + echo.  Harlene  Ciella Obi, FNP-BC Advanced Heart Failure 02/24/24

## 2024-02-24 NOTE — Progress Notes (Signed)
 H&V Care Navigation CSW Progress Note  Clinical Social Worker consulted to assist with financial concerns.  Pt reports he has been unable to work the past 3 months due to his health and has fallen behind on his electric which has been turned off.  Pt normally making $1,100/month and pays $600 in rent.  Is up to date on water  but is $1,200 behind on electric even after $600 awarded from [facility]. Provided with list of local financial assistance programs.  Pt lives alone but is legally married- have been separated for 3 years.  Encouraged pt to apply for Medicaid to help with medical bills.  Pt applying for food stamps- have to turn in pay stubs- has been cleared for work today so will plan to return ASAP.  Informed pt of patient care fund criteria and encouraged him to call me if he is approved for food stamps and we could try to assist with a bill or two to get him back on his feet.  Pt also in process of applying for disability- just applied last month- nothing for us  to assist with at this time.   SDOH Screenings   Food Insecurity: Food Insecurity Present (01/02/2024)  Housing: High Risk (01/02/2024)  Transportation Needs: No Transportation Needs (01/02/2024)  Utilities: At Risk (01/02/2024)  Financial Resource Strain: High Risk (02/24/2024)  Tobacco Use: High Risk (02/04/2024)   Andriette HILARIO Leech, LCSW Clinical Social Worker [facility] Desk#: [phone] Cell#: [phone]

## 2024-02-24 NOTE — Addendum Note (Signed)
 Encounter addended by: Ha Placeres M, CMA on: 02/24/2024 2:06 PM  Actions taken: Visit diagnoses modified, Order list changed, Diagnosis association updated

## 2024-02-24 NOTE — Progress Notes (Signed)
 ReDS Vest / Clip - 02/24/24 0900       ReDS Vest / Clip   Station Marker C    Ruler Value 30    ReDS Value Range Low volume    ReDS Actual Value 28

## 2024-02-25 ENCOUNTER — Other Ambulatory Visit (HOSPITAL_COMMUNITY): Payer: Self-pay

## 2024-02-25 MED ORDER — DIGOXIN 125 MCG PO TABS: 0.1250 mg | ORAL_TABLET | Freq: Every day | ORAL | 3 refills | Status: DC

## 2024-02-25 MED ORDER — ATORVASTATIN CALCIUM 80 MG PO TABS: 80.0000 mg | ORAL_TABLET | Freq: Every day | ORAL | 2 refills | Status: DC

## 2024-02-25 MED ORDER — PRASUGREL HCL 10 MG PO TABS: 10.0000 mg | ORAL_TABLET | Freq: Every day | ORAL | 3 refills | Status: DC

## 2024-02-25 MED ORDER — LOSARTAN POTASSIUM 50 MG PO TABS: 50.0000 mg | ORAL_TABLET | Freq: Every day | ORAL | 2 refills | Status: DC

## 2024-02-25 MED ORDER — CARVEDILOL 3.125 MG PO TABS: 3.1250 mg | ORAL_TABLET | Freq: Two times a day (BID) | ORAL | 2 refills | Status: DC

## 2024-02-25 NOTE — Telephone Encounter (Signed)
 Letter printed,signed and left up front for patient pickup. Patient made aware.

## 2024-02-28 ENCOUNTER — Telehealth (HOSPITAL_COMMUNITY): Payer: Self-pay

## 2024-02-28 NOTE — Telephone Encounter (Signed)
 Pt insurance is active and benefits verified through [facility]. Co-pay $0, DED $1,100/$1,100 met, out of pocket $5,000/$5,000 met, co-insurance 20%. No pre-authorization required. 02/28/2024 @ 11:20am, spoke with Engrid L., REF# [id number].  TCR/ICR? ICR Visit(date of service)limitation? No Can multiple codes be used on the same date of service/visit?(IF ITS A LIMIT) N/A  Is this a lifetime maximum or an annual maximum? Annual Has the member used any of these services to date? No Is there a time limit (weeks/months) on start of program and/or program completion? No

## 2024-02-28 NOTE — Telephone Encounter (Signed)
 Attempted to call patient to schedule cardiac rehab- no answer, left message. Sent MyChart message.

## 2024-03-03 ENCOUNTER — Other Ambulatory Visit (HOSPITAL_COMMUNITY)

## 2024-03-05 ENCOUNTER — Ambulatory Visit (HOSPITAL_COMMUNITY)
Admission: RE | Admit: 2024-03-05 | Discharge: 2024-03-05 | Disposition: A | Discharge status: Home / Self Care | Source: Ambulatory Visit | Attending: Internal Medicine | Admitting: Internal Medicine

## 2024-03-05 ENCOUNTER — Telehealth (HOSPITAL_COMMUNITY): Payer: Self-pay

## 2024-03-05 DIAGNOSIS — I5022 Chronic systolic (congestive) heart failure: Secondary | ICD-10-CM | POA: Diagnosis present

## 2024-03-05 LAB — BASIC METABOLIC PANEL WITH GFR
Anion gap: 13 (ref 5–15)
BUN: 6 mg/dL (ref 6–20)
CO2: 22 mmol/L (ref 22–32)
Calcium: 8.9 mg/dL (ref 8.9–10.3)
Chloride: 104 mmol/L (ref 98–111)
Creatinine, Ser: 1 mg/dL (ref 0.61–1.24)
GFR, Estimated: >60 mL/min (ref >60)
Glucose, Bld: 95 mg/dL (ref 70–99)
Potassium: 3.8 mmol/L (ref 3.5–5.1)
Sodium: 139 mmol/L (ref 135–145)

## 2024-03-05 NOTE — Telephone Encounter (Signed)
 Attempted f/u call regarding cardiac rehab- his mother answered the home phone number, stated she had told him we've called previously and confirmed his cell number. Called patient's cell- no answer, unable to leave message.  Closing referral.

## 2024-03-05 NOTE — Telephone Encounter (Signed)
 Patient left message returning our call. Attempted to call patient- no answer, unable to leave message.

## 2024-03-06 ENCOUNTER — Telehealth (HOSPITAL_COMMUNITY): Payer: Self-pay

## 2024-03-06 NOTE — Telephone Encounter (Signed)
 Patient called back confirming interest in cardiac rehab, reopening referral. Will confirm information is up to date and call back to schedule.

## 2024-03-09 ENCOUNTER — Telehealth (HOSPITAL_COMMUNITY): Payer: Self-pay | Admitting: Cardiology

## 2024-03-09 NOTE — Telephone Encounter (Signed)
 Patient called to request letter for work'  Reports at his employers recommendation he would like to be taken out of work until he ca return with no restrictions.

## 2024-03-11 ENCOUNTER — Other Ambulatory Visit (HOSPITAL_COMMUNITY): Payer: Self-pay

## 2024-03-11 ENCOUNTER — Ambulatory Visit (HOSPITAL_COMMUNITY)
Admission: RE | Admit: 2024-03-11 | Discharge: 2024-03-11 | Disposition: A | Discharge status: Home / Self Care | Source: Ambulatory Visit | Attending: Family Medicine | Admitting: Family Medicine

## 2024-03-11 ENCOUNTER — Ambulatory Visit (HOSPITAL_COMMUNITY): Payer: Self-pay | Admitting: Family Medicine

## 2024-03-11 ENCOUNTER — Encounter (HOSPITAL_COMMUNITY): Payer: Self-pay

## 2024-03-11 VITALS — BP 145/105 | HR 98 | Ht 71.0 in | Wt 135.4 lb

## 2024-03-11 DIAGNOSIS — Z79899 Other long term (current) drug therapy: Secondary | ICD-10-CM | POA: Insufficient documentation

## 2024-03-11 DIAGNOSIS — F1290 Cannabis use, unspecified, uncomplicated: Secondary | ICD-10-CM | POA: Diagnosis not present

## 2024-03-11 DIAGNOSIS — Z7902 Long term (current) use of antithrombotics/antiplatelets: Secondary | ICD-10-CM | POA: Diagnosis not present

## 2024-03-11 DIAGNOSIS — Z7982 Long term (current) use of aspirin: Secondary | ICD-10-CM | POA: Insufficient documentation

## 2024-03-11 DIAGNOSIS — Z955 Presence of coronary angioplasty implant and graft: Secondary | ICD-10-CM | POA: Insufficient documentation

## 2024-03-11 DIAGNOSIS — R42 Dizziness and giddiness: Secondary | ICD-10-CM | POA: Insufficient documentation

## 2024-03-11 DIAGNOSIS — F17210 Nicotine dependence, cigarettes, uncomplicated: Secondary | ICD-10-CM | POA: Diagnosis not present

## 2024-03-11 DIAGNOSIS — I110 Hypertensive heart disease with heart failure: Secondary | ICD-10-CM | POA: Insufficient documentation

## 2024-03-11 DIAGNOSIS — R0602 Shortness of breath: Secondary | ICD-10-CM | POA: Insufficient documentation

## 2024-03-11 DIAGNOSIS — I2510 Atherosclerotic heart disease of native coronary artery without angina pectoris: Secondary | ICD-10-CM | POA: Insufficient documentation

## 2024-03-11 DIAGNOSIS — Z91148 Patient's other noncompliance with medication regimen for other reason: Secondary | ICD-10-CM | POA: Insufficient documentation

## 2024-03-11 DIAGNOSIS — I252 Old myocardial infarction: Secondary | ICD-10-CM | POA: Diagnosis not present

## 2024-03-11 DIAGNOSIS — I493 Ventricular premature depolarization: Secondary | ICD-10-CM | POA: Insufficient documentation

## 2024-03-11 DIAGNOSIS — I5022 Chronic systolic (congestive) heart failure: Secondary | ICD-10-CM | POA: Insufficient documentation

## 2024-03-11 DIAGNOSIS — F1910 Other psychoactive substance abuse, uncomplicated: Secondary | ICD-10-CM | POA: Insufficient documentation

## 2024-03-11 LAB — BASIC METABOLIC PANEL WITH GFR
Anion gap: 5 (ref 5–15)
BUN: 11 mg/dL (ref 6–20)
CO2: 24 mmol/L (ref 22–32)
Calcium: 8.6 mg/dL — ABNORMAL LOW (ref 8.9–10.3)
Chloride: 105 mmol/L (ref 98–111)
Creatinine, Ser: 1.08 mg/dL (ref 0.61–1.24)
GFR, Estimated: >60 mL/min (ref >60)
Glucose, Bld: 235 mg/dL — ABNORMAL HIGH (ref 70–99)
Potassium: 3.5 mmol/L (ref 3.5–5.1)
Sodium: 134 mmol/L — ABNORMAL LOW (ref 135–145)

## 2024-03-11 LAB — BRAIN NATRIURETIC PEPTIDE: B Natriuretic Peptide: 397.4 pg/mL — ABNORMAL HIGH (ref 0.0–100.0)

## 2024-03-11 LAB — DIGOXIN LEVEL: Digoxin Level: <0.6 ng/mL — ABNORMAL LOW (ref 0.8–2.0)

## 2024-03-11 MED ORDER — CARVEDILOL 3.125 MG PO TABS
3.1250 mg | ORAL_TABLET | Freq: Two times a day (BID) | ORAL | 2 refills | Status: AC
  Filled 2024-03-11: qty 180, 90d supply, fill #0

## 2024-03-11 MED ORDER — LOSARTAN POTASSIUM 50 MG PO TABS
50.0000 mg | ORAL_TABLET | Freq: Every day | ORAL | 2 refills | Status: DC
  Filled 2024-03-11: qty 90, 90d supply, fill #0

## 2024-03-11 MED ORDER — SPIRONOLACTONE 25 MG PO TABS
12.5000 mg | ORAL_TABLET | Freq: Every day | ORAL | 3 refills | Status: AC
  Filled 2024-03-11: qty 45, 90d supply, fill #0

## 2024-03-11 MED ORDER — ASPIRIN 81 MG PO TBEC
81.0000 mg | DELAYED_RELEASE_TABLET | Freq: Every day | ORAL | 3 refills | Status: AC
  Filled 2024-03-11 (×2): qty 90, 90d supply, fill #0

## 2024-03-11 MED ORDER — SPIRONOLACTONE 25 MG PO TABS
25.0000 mg | ORAL_TABLET | Freq: Every day | ORAL | 2 refills | Status: DC
  Filled 2024-03-11: qty 90, 90d supply, fill #0

## 2024-03-11 MED ORDER — ATORVASTATIN CALCIUM 80 MG PO TABS
80.0000 mg | ORAL_TABLET | Freq: Every day | ORAL | 1 refills | Status: AC
  Filled 2024-03-11: qty 90, 90d supply, fill #0

## 2024-03-11 MED ORDER — PRASUGREL HCL 10 MG PO TABS
10.0000 mg | ORAL_TABLET | Freq: Every day | ORAL | 3 refills | Status: AC
  Filled 2024-03-11: qty 30, 30d supply, fill #0
  Filled 2024-03-11: qty 90, 90d supply, fill #0
  Filled 2024-03-16: qty 60, 60d supply, fill #1
  Filled 2024-04-23: qty 30, 30d supply, fill #1

## 2024-03-11 MED ORDER — DIGOXIN 125 MCG PO TABS
0.1250 mg | ORAL_TABLET | Freq: Every day | ORAL | 3 refills | Status: AC
  Filled 2024-03-11: qty 90, 90d supply, fill #0

## 2024-03-11 NOTE — Patient Instructions (Addendum)
 Good to see you today!  RESTART ALL Medications  Labs done today, your results will be available in MyChart, we will contact you for abnormal readings.  Your physician recommends that you schedule a follow-up appointment   If you have any questions or concerns before your next appointment please send us  a message through [place] or call our office at [phone].    TO LEAVE A MESSAGE FOR THE NURSE SELECT OPTION 2, PLEASE LEAVE A MESSAGE INCLUDING: YOUR NAME DATE OF BIRTH CALL BACK NUMBER REASON FOR CALL**this is important as we prioritize the call backs  YOU WILL RECEIVE A CALL BACK THE SAME DAY AS LONG AS YOU CALL BEFORE 4:00 PM At the [facility], you and your health needs are our priority. As part of our continuing mission to provide you with exceptional heart care, we have created designated Provider Care Teams. These Care Teams include your primary Cardiologist (physician) and Advanced Practice Providers (APPs- Physician Assistants and Nurse Practitioners) who all work together to provide you with the care you need, when you need it.   You may see any of the following providers on your designated Care Team at your next follow up: Dr Toribio Fuel Dr Ezra Shuck Dr. Ria Commander Dr. [name] Amy Lenetta, NP Caffie Shed, [place] [facility] [place], [place] Beckey Coe, NP [place] Lee, NP Ellouise Class, NP Tinnie Redman, PharmD Jaun Bash, PharmD   Please be sure to bring in all your medications bottles to every appointment.    Thank you for choosing [facility]

## 2024-03-11 NOTE — Addendum Note (Signed)
 Encounter addended by: Glena Harlene HERO, FNP on: 03/11/2024 1:34 PM  Actions taken: Clinical Note Signed

## 2024-03-11 NOTE — Progress Notes (Signed)
 ReDS Vest / Clip - 03/11/24 1030       ReDS Vest / Clip   Station Marker C    Ruler Value 25    ReDS Value Range Low volume    ReDS Actual Value 35

## 2024-03-11 NOTE — Progress Notes (Addendum)
 ADVANCED HEART FAILURE FOLLOW UP NOTE   Primary Care: Loreli Elyn SAILOR, MD Primary Cardiologist: Dr. Zenaida  HPI: Joseph Hess is a 58 y.o. male who presents for follow up of chronic systolic heart failure and recent inferior STEMI.     Admitted on 01/02/24 with chest discomfort and found to have an inferior STEMI. Underwent successful PCI to the RCA, mild nonobstructive disease otherwise. Very large, dominant RCA. Resulting EF <30%, patient was started on low dose GDMT and discharged with close follow up.     Follow up 10/25, off all GDMT with NYHA IIb-III symptoms. Volume low/normal so kept off SGLT2i and loops. Coreg , losartan , spiro and dig restarted and meds to [facility] pharmacy.   SUBJECTIVE: Today he returns for an acute visit. Seen in UC earlier today with complaints of dizziness. BP 150s/110s, has not had meds. He has SOB walking longer distances on flat ground. Denies palpitations, abnormal bleeding, CP, edema, or PND/Orthopnea. Appetite ok. Weight at home 131-133 pounds. He does not know what medications he is taking and never picked up from pharmacy after last visit. Smokes 1/2 pack per week, smokes THC all day, drinks ETOH on weekends. 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  PMH, current medications, allergies, social history, and family history reviewed in epic.  Wt Readings from Last 3 Encounters:  03/11/24 61.4 kg (135 lb 6.4 oz)  02/24/24 62.1 kg (136 lb 12.8 oz)  02/04/24 58.1 kg (128 lb)   BP (!) 145/105   Pulse 98   Ht 5' 11 (1.803 m)   Wt 61.4 kg (135 lb 6.4 oz)   SpO2 98%   BMI 18.88 kg/m   Orthostatics today 03/11/24 Lying: 151/87, 100 Sitting: 145/105, 99 Standing: 144/97, 110  PHYSICAL EXAM: General:  NAD. No resp difficulty, walked into clinic, thin HEENT: Normal Neck: Supple. No JVD. Cor: Regular rate & rhythm. No rubs, gallops or murmurs. Lungs: Clear Abdomen: Soft, nontender, nondistended.  Extremities: No cyanosis, clubbing, rash, edema Neuro:  Alert & oriented x 3, moves all 4 extremities w/o difficulty. Affect pleasant.  ReDs reading: 35 %, normal  DATA REVIEW  ZIO: 9/25: Mostly NSR, 25 SVT runs, 8% PVC burden  ECG: 01/03/2024: NSR with PACs, inferior infarct   02/04/24: NSR with frequent PVCs  03/11/24: NSR with PVCs  ECHO: 01/02/2024: LVEF 25-30%, global hypokinesis, RV mildly reduced, valves fine   CATH: 01/02/2024: RCA 100% occluded, relatively poor flow, 40% RI   ASSESSMENT & PLAN:  Chronic systolic heart failure: Suspected ischemic, but global LV dysfunction out of proportion to RCA infarct suggests some may be do to history of heavy alcohol use. NYHA IIb-III, volume OK today. ReDs 35% - Medication non-compliance continues to be an issue. This is 2nd clinic visit where he has disclosed that he has not picked up his medications from the pharmacy. - Restart digoxin  0.125 mg daily for RV support - Restart carvedilol  3.125 mg bid - Restart losartan  50 mg daily (entresto  not covered) - Restart spironolactone  25 mg daily - Remain off Jardiance  with orthostasis - Labs today - Plan to repeat echo when he can consistently demonstrate med compliance  CAD: Recent RCA STEMI - No chest pain - Restart aspirin  + prasugrel  10 mg daily - Restart atorvastatin  80 mg daily - Referred to Cardiac rehab  - Check LFTs/lipids in 8 weeks  PVCs: asymptomatic - 2 week Zio showed mostly NSR, 8% PVC burden - Restart Coreg  as above  Polysubstance abuse - Smoking 2-3 cigs/day -  Cut back on ETOH recently - Smokes THC daily  - Continue to recommend complete cessation.  HTN - BP elevated  - Has had orthostasis in the past - I asked him to check BP daily and log - Meds as above - Labs today  SDOH: he has transportation and insurance. - Medication management is an issue. - Not a candidate for paramedicine. - Meds to [facility] pharmacy, will need pillpacks or delivery once regimen has stabilized - Staff picked him meds from pharmacy and  filled pillbox for 2 weeks  Cancel follow up and echo next month with Dr. Zenaida.  Follow up in 1 week with Pharmacy (med check and GDMT titration) and 3-4 weeks with APP. Stay out of work until APP follow up  [facility], FNP-BC Advanced Heart Failure 03/11/24

## 2024-03-11 NOTE — Progress Notes (Signed)
 H&V Care Navigation CSW Progress Note  Clinical Social Worker consulted to assist with med as pt has not been taking meds due to cost.  CSW able to assist with copays today.   Andriette HILARIO Leech, LCSW Clinical Social Worker [facility] Desk#: [phone] Cell#: [phone]

## 2024-03-12 NOTE — Telephone Encounter (Signed)
 Letter written at 10/23 OV

## 2024-03-13 ENCOUNTER — Telehealth (HOSPITAL_COMMUNITY): Payer: Self-pay

## 2024-03-13 NOTE — Telephone Encounter (Signed)
 Called patient to see if he was interested in participating in the Cardiac Rehab Program. Patient will come in for orientation on 11/04 and will attend the 8:15 exercise class.  Sent MyChart message.

## 2024-03-16 ENCOUNTER — Other Ambulatory Visit (HOSPITAL_COMMUNITY): Payer: Self-pay

## 2024-03-16 ENCOUNTER — Other Ambulatory Visit: Payer: Self-pay

## 2024-03-16 NOTE — Progress Notes (Incomplete)
 Advanced Heart Failure Clinic Note  Primary Care: Loreli Elyn SAILOR, MD Primary Cardiologist: Dr. Zenaida  HPI:  Joseph Hess is a 58 y.o. male who presents for follow up of chronic systolic heart failure and recent inferior STEMI.   Admitted on 01/02/24 with chest discomfort and found to have an inferior STEMI. Underwent successful PCI to the RCA, mild nonobstructive disease otherwise. Very large, dominant RCA. Resulting EF <30%, patient was started on low dose GDMT and discharged with close follow up.   Followed up 02/24/24, he was off all GDMT with NYHA IIb-III symptoms. Volume low/normal so kept off SGLT2i and loops. Coreg , losartan , spiro and dig restarted and meds sent to [facility] pharmacy.   Followed up 03/11/24 for an acute visit. Seen in UC earlier in the day with complaints of dizziness. BP 150s/110s, had not had meds. He was SOB walking longer distances on flat ground. Denied palpitations, abnormal bleeding, CP, edema, or PND/Orthopnea. Appetite was ok. Weight at home was 131-133 pounds. He did not know what medications he was supposed to be taking and never picked them up from the pharmacy after his last visit. Smoking 1/2 pack per week and smoking THC all day, drinking ETOH on weekends. 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 Again instructed him to restart GDMT and staff assisted with picking up his medications and filling a pill box for him.  Today he returns to HF clinic for pharmacist medication titration. At last visit with APP, he reported non-adherence to his medications. Digoxin , carvedilol , losartan , and spironolactone  were restarted as previously discussed. Staff picked up his medications and filled a pill box for 2 weeks for him. Overall patient reports he has been feeling about the same since restarting medications last week. He did not bring his medications or pill box with him today, but he reports adherence to all his medications as prescribed since the pill box was filled. He says  he has 1 week left of medications in the pill box. Denies chest pain. Endorses occasional palpitations. Says he still has occasional SOB and dizziness with activity. Reports stopping once to take some deep breaths when he was walking from the parking deck across the street to clinic for the appointment. He walked from the waiting area to the exam room without issues. Denies LEE and abdominal bloating. Denies PND/orthopnea. Feels his appetite is great and he has been eating more. Reports he mainly cooks food at home and does not add salt. Reports his weight at home has been ~134 lbs. Cannot recall home SBP readings, but he thinks his DBP was ~100.  HF Medications: Carvedilol  3.125 mg BID Losartan  50 mg daily Spironolactone  12.5 mg daily Digoxin  0.125 mg daily  Has the patient been experiencing any side effects to the medications prescribed?  No  Does the patient have any problems obtaining medications due to transportation or finances?   Yes - reports trouble in the past affording medications, but no issues currently now that he is getting medications from [facility] pharmacy  Understanding of regimen: poor Understanding of indications: poor Potential of compliance: fair Patient understands to avoid NSAIDs. Patient understands to avoid decongestants.   Pertinent Lab Values: 03/11/24: Serum creatinine 1.08, BUN 11, Potassium 3.5, Sodium 134, BNP 397.4, Digoxin  <0.6   Vital Signs: Weight: 140 lbs (last clinic weight: 135 lbs) Blood pressure: 152/90  Heart rate: 70   Assessment/Plan: Chronic systolic heart failure: Suspected ischemic, but global LV dysfunction out of proportion to RCA infarct suggests some  may be do to history of heavy alcohol use. NYHA IIb-III, volume OK today. No LEE on exam. Feels at his baseline. BP remains elevated. - Continue digoxin  0.125 mg daily for RV support - Continue carvedilol  3.125 mg bid - Increase losartan  to 100 mg daily (Entresto  is non-formulary with his  insurance). Counseled that he can add another losartan  50 mg tablet to his pill box for a total daily dose of 100 mg.  - Continue spironolactone  12.5 mg daily - Remain off Jardiance  with orthostasis - BMET today is stable - History of medication non-adherence. At last visit, staff assisted with picking up his medications and filling 2 weeks of a pill box for him. This week he reports improved adherence and denies any missed doses, although he did not bring his medication bottles or pill box to the visit today as instructed. Due to adherence concerns will have him come back for pharmacy clinic visit next week and stressed the importance of bringing his pill box and medication bottles. - Plan to repeat echo when he can consistently demonstrate med compliance   CAD: Recent RCA STEMI - No chest pain - Restart aspirin  + prasugrel  10 mg daily - Restart atorvastatin  80 mg daily - Referred to Cardiac rehab  - Check LFTs/lipids in 8 weeks   PVCs: asymptomatic - 2 week Zio showed mostly NSR, 8% PVC burden - Coreg  as above   Polysubstance abuse - Smoking 2-3 cigs/day - Cut back on ETOH recently - Smokes THC daily  - Continue to recommend complete cessation.   HTN - BP elevated  - Has had orthostasis in the past - Counseled to check BP daily and bring readings to his visits - Meds as above - BMET today is stable   SDOH: he has transportation and insurance. - Medication management is an issue. - Not a candidate for paramedicine. - Meds to [facility] pharmacy, will need pillpacks or delivery once regimen has stabilized - Adherence appears improved after a pill box was filled for him during last week's visit. Due to adherence concerns, will only make 1 medication change today as above.  Follow up with Pharmacy Clinic next Thursday and APP clinic on 04/08/24  Izetta Henry, PharmD Clinical Pharmacist

## 2024-03-18 ENCOUNTER — Ambulatory Visit (HOSPITAL_COMMUNITY)
Admission: RE | Admit: 2024-03-18 | Discharge: 2024-03-18 | Disposition: A | Discharge status: Home / Self Care | Source: Ambulatory Visit | Attending: Cardiology | Admitting: Cardiology

## 2024-03-18 ENCOUNTER — Other Ambulatory Visit (HOSPITAL_COMMUNITY): Payer: Self-pay

## 2024-03-18 ENCOUNTER — Ambulatory Visit (HOSPITAL_COMMUNITY): Payer: Self-pay | Admitting: Cardiology

## 2024-03-18 VITALS — BP 152/90 | HR 70 | Wt 140.2 lb

## 2024-03-18 DIAGNOSIS — I252 Old myocardial infarction: Secondary | ICD-10-CM | POA: Diagnosis not present

## 2024-03-18 DIAGNOSIS — Z79899 Other long term (current) drug therapy: Secondary | ICD-10-CM | POA: Diagnosis not present

## 2024-03-18 DIAGNOSIS — Z7982 Long term (current) use of aspirin: Secondary | ICD-10-CM | POA: Diagnosis not present

## 2024-03-18 DIAGNOSIS — F1090 Alcohol use, unspecified, uncomplicated: Secondary | ICD-10-CM | POA: Diagnosis not present

## 2024-03-18 DIAGNOSIS — I5022 Chronic systolic (congestive) heart failure: Secondary | ICD-10-CM | POA: Diagnosis present

## 2024-03-18 DIAGNOSIS — F17210 Nicotine dependence, cigarettes, uncomplicated: Secondary | ICD-10-CM | POA: Insufficient documentation

## 2024-03-18 DIAGNOSIS — Z91148 Patient's other noncompliance with medication regimen for other reason: Secondary | ICD-10-CM | POA: Diagnosis not present

## 2024-03-18 DIAGNOSIS — F1290 Cannabis use, unspecified, uncomplicated: Secondary | ICD-10-CM | POA: Diagnosis not present

## 2024-03-18 DIAGNOSIS — I2510 Atherosclerotic heart disease of native coronary artery without angina pectoris: Secondary | ICD-10-CM | POA: Insufficient documentation

## 2024-03-18 DIAGNOSIS — F1910 Other psychoactive substance abuse, uncomplicated: Secondary | ICD-10-CM | POA: Insufficient documentation

## 2024-03-18 DIAGNOSIS — I110 Hypertensive heart disease with heart failure: Secondary | ICD-10-CM | POA: Diagnosis not present

## 2024-03-18 LAB — BASIC METABOLIC PANEL WITH GFR
Anion gap: 15 (ref 5–15)
BUN: 15 mg/dL (ref 6–20)
CO2: 22 mmol/L (ref 22–32)
Calcium: 8.8 mg/dL — ABNORMAL LOW (ref 8.9–10.3)
Chloride: 101 mmol/L (ref 98–111)
Creatinine, Ser: 1.03 mg/dL (ref 0.61–1.24)
GFR, Estimated: >60 mL/min (ref >60)
Glucose, Bld: 99 mg/dL (ref 70–99)
Potassium: 3.8 mmol/L (ref 3.5–5.1)
Sodium: 138 mmol/L (ref 135–145)

## 2024-03-18 MED ORDER — LOSARTAN POTASSIUM 100 MG PO TABS
100.0000 mg | ORAL_TABLET | Freq: Every day | ORAL | 5 refills | Status: DC
  Filled 2024-03-18: qty 30, 30d supply, fill #0

## 2024-03-18 NOTE — Patient Instructions (Addendum)
 It was a pleasure seeing you today!  MEDICATIONS: -We are changing your medications today -Increase losartan  to 100 mg daily. You can take 2 of the losartan  50 mg tablets. There is already 1 tablet in your pill box, so you will just need to add 1 of the losartan  50 mg tablets to your pill box.  -Call if you have questions about your medications.  LABS: -We will call you if your labs need attention.  NEXT APPOINTMENT: Return to clinic in 1 week with Pharmacy Clinic. Please bring all of your medication bottles and your pill box.  In general, to take care of your heart failure: -Limit your fluid intake to 2 Liters (half-gallon) per day.   -Limit your salt intake to ideally 2-3 grams (2000-3000 mg) per day. -Weigh yourself daily and record, and bring that weight diary to your next appointment.  (Weight gain of 2-3 pounds in 1 day typically means fluid weight.) -The medications for your heart are to help your heart and help you live longer.   -Please contact us  before stopping any of your heart medications.  Call the clinic at [phone] with questions or to reschedule future appointments.

## 2024-03-18 NOTE — Addendum Note (Signed)
 Encounter addended by: Bonnell Izetta SAUNDERS, RPH on: 03/18/2024 3:20 PM  Actions taken: Visit diagnoses modified, Diagnosis association updated, Clinical Note Signed

## 2024-03-19 ENCOUNTER — Telehealth (HOSPITAL_COMMUNITY): Payer: Self-pay | Admitting: Licensed Clinical Social Worker

## 2024-03-19 NOTE — Telephone Encounter (Signed)
 H&V Care Navigation CSW Progress Note  Clinical Social Worker received call from pt requesting I send him resources we had discussed in clinic a few weeks ago.  CSW re-sent him information about applying for Medicaid and local financial resources.  Patient continues to be behind on rent and utilities as he has been unable to return to work- seeing physician in November to be reconsidered for work.  Has applied for community assistance options but has not found any help.  CSW unable to help through patient care fund as patient does not meet criteria.  Will continue to follow and assist as needed  Andriette HILARIO Leech, LCSW Clinical Social Worker Advanced Heart Failure Clinic Desk#: [phone] Cell#: [phone]

## 2024-03-23 ENCOUNTER — Telehealth (HOSPITAL_COMMUNITY): Payer: Self-pay

## 2024-03-23 NOTE — Telephone Encounter (Signed)
 Confirmed cardiac orientation appointment time of 03/24/24 at 0830.  Cardiac health history completed.

## 2024-03-24 ENCOUNTER — Telehealth (HOSPITAL_COMMUNITY): Payer: Self-pay | Admitting: Cardiology

## 2024-03-24 ENCOUNTER — Encounter (HOSPITAL_COMMUNITY): Payer: Self-pay

## 2024-03-24 ENCOUNTER — Encounter (HOSPITAL_COMMUNITY)
Admission: RE | Admit: 2024-03-24 | Discharge: 2024-03-24 | Disposition: A | Discharge status: Home / Self Care | Source: Ambulatory Visit | Attending: Cardiology | Admitting: Cardiology

## 2024-03-24 DIAGNOSIS — I2111 ST elevation (STEMI) myocardial infarction involving right coronary artery: Secondary | ICD-10-CM | POA: Insufficient documentation

## 2024-03-24 DIAGNOSIS — Z955 Presence of coronary angioplasty implant and graft: Secondary | ICD-10-CM | POA: Insufficient documentation

## 2024-03-24 NOTE — Progress Notes (Signed)
 Incomplete Session Note  Patient Details  Name: Joseph Hess MRN: [id number] Date of Birth: [birth year] Referring Provider:    [name] did not complete his rehab session.  Patient here for cardiac rehab. Vital signs are as follows. BP right arm 160/92. Left arm 172/90. Heart rate 76.  Will notify onsite provider [name] and the heart failure clinic. [name] notified. Patient says he will go home and take his medications. Medications reviewed. [name] says he is taking his medications as prescribed. Cardiac rehab orientation rescheduled for 04/02/24 at 0800. The heart failure clinic was also notified about elevated BP's. [name] did not complete orientation today due to elevated BP. [name] did not have any complaints or concerns upon exit from cardiac rehab. Patient aware of new appointment.[name] RN BSN

## 2024-03-24 NOTE — Telephone Encounter (Signed)
 160/92 170/88 No meds this AM  Per Hadassah with cardiac rehab -pt was rescheduled and advised to take meds 1 hour to appt.   Above noted as RICK

## 2024-03-24 NOTE — Progress Notes (Signed)
 Cardiac Rehab Medication Review by a Nurse  Does the patient  feel that his/her medications are working for him/her?  yes  Has the patient been experiencing any side effects to the medications prescribed?  no  Does the patient measure his/her own blood pressure or blood glucose at home?  yes   Does the patient have any problems obtaining medications due to transportation or finances?   no  Understanding of regimen: good Understanding of indications: good Potential of compliance: fair    Nurse comments: Joseph Hess says he checks his blood pressures every other day. Medications reviewed. Patient says he is taking his medications but did not take his medications this morning. See previous documentation. Asked Joseph Hess to bring his medications with him to his next scheduled orientation appointment.[name] RN BSN     [name] 03/24/2024 8:48 AM

## 2024-03-25 NOTE — Progress Notes (Signed)
 Advanced Heart Failure Clinic Note  Primary Care: Loreli Elyn SAILOR, MD Primary Cardiologist: [name], MD  HPI:  Joseph Hess is a 58 y.o. male who presents for follow up of chronic systolic heart failure and recent inferior STEMI.   Admitted on 01/02/24 with chest discomfort and found to have an inferior STEMI. Underwent successful PCI to the RCA, mild nonobstructive disease otherwise. Very large, dominant RCA. Resulting EF <30%, patient was started on low dose GDMT and discharged with close follow up.   Followed up 02/24/24, he was off all GDMT with NYHA IIb-III symptoms. Volume low/normal so kept off SGLT2i and loops. Coreg , losartan , spiro and dig restarted and meds sent to [facility] pharmacy.   Followed up 03/11/24 for an acute visit. Seen in UC earlier in the day with complaints of dizziness. BP 150s/110s without taking AM meds. He did not know what medications he was supposed to be taking and never picked them up from the pharmacy after his last visit.  Seen by HF pharmacy 03/18/24 where he reported medication adherence.  Today he returns to HF clinic for pharmacist medication titration. HE brought his medications with him today and reports taking as instructed. Denies chest pain. Endorses occasional palpitations. Does have lethargy. Says he still has SOB with exertion. Dizziness is rare. Denies LEE and abdominal bloating. Denies PND/orthopnea. Feels his appetite is great and he has been eating more. Reports he mainly cooks food at home and does not add salt. Reports his weight at home has been ~134 lbs. Has not been checking BP at home. He increased losartan  to 100 mg daily as instructed.   HF Medications: Carvedilol  3.125 mg BID Losartan  100 mg daily Spironolactone  12.5 mg daily Digoxin  0.125 mg daily  Has the patient been experiencing any side effects to the medications prescribed?  No  Does the patient have any problems obtaining medications due to transportation or finances?    Yes - reports trouble in the past affording medications, but no issues currently now that he is getting medications from [facility] pharmacy  Understanding of regimen: poor Understanding of indications: poor Potential of compliance: fair Patient understands to avoid NSAIDs. Patient understands to avoid decongestants.   Pertinent Lab Values: 03/18/24: Serum creatinine 1.03, BUN 15, Potassium 3.8, Sodium 138  Vital Signs: Weight: 140 lbs (last clinic weight: 135 lbs) Blood pressure: 152/90  Heart rate: 70   Assessment/Plan: Chronic systolic heart failure: Suspected ischemic, but global LV dysfunction out of proportion to RCA infarct suggests some may be do to history of heavy alcohol use. NYHA IIb-III, volume OK today. No LEE on exam. Feels at his baseline. BP remains elevated. - Continue digoxin  0.125 mg daily for RV support - Continue carvedilol  3.125 mg bid. Will not titrate today given lethargy/fatigue, but can consider next visit. - Entresto  is covered on insurance for $25. Given greater anti-hypertensive potency, will transition to 49/51 mg BID. Stop taking losartan . Will need to monitor volume closely, patient aware to target 2L of water  per day and more if orthostasis occurs with a normal to high BP.. - Continue spironolactone  12.5 mg daily. Given previous hypovolemia, wt being down, and difficulty with multiple changes at once, will avoid increasing today. - Remain off Jardiance  with orthostasis. Can consider restarting eventually, however given sensitivity to volume changes, would titrate GDMT with BP lowering effects first - Check BMET and BNP today  - History of medication non-adherence. At last visit, staff assisted with picking up his medications and  filling 2 weeks of a pill box for him. This week he reports improved adherence and denies any missed doses. He brought medications to clinic today and reports adherence. - Plan to repeat echo when he can consistently demonstrate med  compliance   CAD: Recent RCA STEMI - No chest pain - Restart aspirin  + prasugrel  10 mg daily - Restart atorvastatin  80 mg daily - Referred to Cardiac rehab     PVCs: asymptomatic - 2 week Zio showed mostly NSR, 8% PVC burden - Coreg  as above   Polysubstance abuse - Smoking 2-3 cigs/day - Cut back on ETOH recently - Smokes THC daily  - Continue to recommend complete cessation.   HTN - BP elevated  - Has had orthostasis in the past - Counseled to check BP daily and bring readings to his visits - Meds as above - BMET today     SDOH: he has transportation and insurance. - Medication management is an issue. - Not a candidate for paramedicine. - Meds to [facility] pharmacy, will need pillpacks or delivery once regimen has stabilized - Adherence appears improved after a pill box was filled for him during last week's visit. Due to adherence concerns, will only make 1 medication change today as above.  Follow up with APP clinic on 04/08/24. Can be referred for further medication titration if needed.  Please do not hesitate to reach out with questions or concerns,  Jaun Bash, PharmD, CPP, BCPS, [facility] Heart Failure Pharmacist  Phone - [phone] 03/25/2024 8:37 AM

## 2024-03-26 ENCOUNTER — Other Ambulatory Visit (HOSPITAL_COMMUNITY): Payer: Self-pay

## 2024-03-26 ENCOUNTER — Ambulatory Visit (HOSPITAL_COMMUNITY): Payer: Self-pay | Admitting: Pharmacist

## 2024-03-26 ENCOUNTER — Ambulatory Visit (HOSPITAL_COMMUNITY)
Admission: RE | Admit: 2024-03-26 | Discharge: 2024-03-26 | Disposition: A | Discharge status: Home / Self Care | Source: Ambulatory Visit | Attending: Cardiology | Admitting: Cardiology

## 2024-03-26 VITALS — BP 166/96 | HR 83 | Wt 134.0 lb

## 2024-03-26 DIAGNOSIS — F1910 Other psychoactive substance abuse, uncomplicated: Secondary | ICD-10-CM | POA: Insufficient documentation

## 2024-03-26 DIAGNOSIS — I5021 Acute systolic (congestive) heart failure: Secondary | ICD-10-CM

## 2024-03-26 DIAGNOSIS — I2510 Atherosclerotic heart disease of native coronary artery without angina pectoris: Secondary | ICD-10-CM | POA: Diagnosis not present

## 2024-03-26 DIAGNOSIS — I110 Hypertensive heart disease with heart failure: Secondary | ICD-10-CM | POA: Insufficient documentation

## 2024-03-26 DIAGNOSIS — Z7982 Long term (current) use of aspirin: Secondary | ICD-10-CM | POA: Insufficient documentation

## 2024-03-26 DIAGNOSIS — Z716 Tobacco abuse counseling: Secondary | ICD-10-CM | POA: Insufficient documentation

## 2024-03-26 DIAGNOSIS — F17210 Nicotine dependence, cigarettes, uncomplicated: Secondary | ICD-10-CM | POA: Diagnosis not present

## 2024-03-26 DIAGNOSIS — Z91148 Patient's other noncompliance with medication regimen for other reason: Secondary | ICD-10-CM | POA: Diagnosis not present

## 2024-03-26 DIAGNOSIS — I252 Old myocardial infarction: Secondary | ICD-10-CM | POA: Diagnosis not present

## 2024-03-26 DIAGNOSIS — I5022 Chronic systolic (congestive) heart failure: Secondary | ICD-10-CM | POA: Insufficient documentation

## 2024-03-26 DIAGNOSIS — Z79899 Other long term (current) drug therapy: Secondary | ICD-10-CM | POA: Insufficient documentation

## 2024-03-26 LAB — BASIC METABOLIC PANEL WITH GFR
Anion gap: 12 (ref 5–15)
BUN: 10 mg/dL (ref 6–20)
CO2: 25 mmol/L (ref 22–32)
Calcium: 8.6 mg/dL — ABNORMAL LOW (ref 8.9–10.3)
Chloride: 100 mmol/L (ref 98–111)
Creatinine, Ser: 1.02 mg/dL (ref 0.61–1.24)
GFR, Estimated: >60 mL/min (ref >60)
Glucose, Bld: 188 mg/dL — ABNORMAL HIGH (ref 70–99)
Potassium: 3.3 mmol/L — ABNORMAL LOW (ref 3.5–5.1)
Sodium: 137 mmol/L (ref 135–145)

## 2024-03-26 LAB — BRAIN NATRIURETIC PEPTIDE: B Natriuretic Peptide: 565.3 pg/mL — ABNORMAL HIGH (ref 0.0–100.0)

## 2024-03-26 MED ORDER — SACUBITRIL-VALSARTAN 49-51 MG PO TABS
1.0000 | ORAL_TABLET | Freq: Two times a day (BID) | ORAL | 3 refills | Status: DC
  Filled 2024-03-26: qty 60, 30d supply, fill #0

## 2024-03-26 NOTE — Patient Instructions (Addendum)
 It was a pleasure seeing you today!  MEDICATIONS: -Stop taking losartan  -Start taking Entresto  49/51 mg by mouth twice daily -Drink 2 L (4 normal water  bottles) per day -Call if you have questions about your medications.  LABS: -Re-check labs in 1 week  NEXT APPOINTMENT: Return to clinic 04/08/24 to see Dr. Zenaida.  In general, to take care of your heart failure: -Limit your fluid intake to 2 Liters (half-gallon) per day.   -Limit your salt intake to ideally 2-3 grams (2000-3000 mg) per day. -Weigh yourself daily and record, and bring that weight diary to your next appointment.  (Weight gain of 2-3 pounds in 1 day typically means fluid weight.) -The medications for your heart are to help your heart and help you live longer.   -Please contact us  before stopping any of your heart medications.  Call the clinic at [phone] with questions or to reschedule future appointments.

## 2024-03-30 ENCOUNTER — Encounter (HOSPITAL_COMMUNITY)

## 2024-04-01 ENCOUNTER — Telehealth (HOSPITAL_COMMUNITY): Payer: Self-pay

## 2024-04-01 ENCOUNTER — Encounter (HOSPITAL_COMMUNITY)

## 2024-04-01 ENCOUNTER — Telehealth (HOSPITAL_COMMUNITY): Payer: Self-pay | Admitting: *Deleted

## 2024-04-01 NOTE — Telephone Encounter (Signed)
 Attempted to confirm cardiac rehab orientation date of 04/02/24 @ 0800.  Pt's mother answered the phone, but she was unsure if pt was planning on coming to his CR orientation appointment.  Stated she would try to get ahold of him to inform him of his appointment.

## 2024-04-01 NOTE — Telephone Encounter (Signed)
 Left message to call cardiac rehab.[name] RN BSN

## 2024-04-01 NOTE — Telephone Encounter (Signed)
 Attempted to confirm cardiac rehab orientation date of 04/02/24 @ 0800.  Unable to leave message.

## 2024-04-02 ENCOUNTER — Encounter (HOSPITAL_COMMUNITY)
Admission: RE | Admit: 2024-04-02 | Discharge: 2024-04-02 | Disposition: A | Discharge status: Home / Self Care | Source: Ambulatory Visit | Attending: Cardiology | Admitting: Cardiology

## 2024-04-02 ENCOUNTER — Ambulatory Visit (HOSPITAL_COMMUNITY)

## 2024-04-02 VITALS — BP 130/78 | HR 86 | Ht 70.5 in | Wt 135.6 lb

## 2024-04-02 DIAGNOSIS — I2111 ST elevation (STEMI) myocardial infarction involving right coronary artery: Secondary | ICD-10-CM | POA: Diagnosis present

## 2024-04-02 DIAGNOSIS — Z955 Presence of coronary angioplasty implant and graft: Secondary | ICD-10-CM | POA: Diagnosis present

## 2024-04-02 LAB — GLUCOSE, CAPILLARY: Glucose-Capillary: 145 mg/dL — ABNORMAL HIGH (ref 70–99)

## 2024-04-02 NOTE — Progress Notes (Addendum)
 Patient here for cardiac rehab orientation. Blood pressure improved today 130/78 heart rate 86. Oxygen saturation 99% on room air. CBG 145. Jakeel admits to eating a lot of candy, hawaiian bread and rice. Rollen says he has not been told that he is diabetic. Aaiden says that since he is not drinking as much alcohol he has been eating more candy. Syaire says that he has not seen his primary care Dr Loreli in along time. Appointment made for [place] to see Dr Loreli on 05/04/24  at 10:40 AM for elevated HGB AIC which was 6.9 on 01/02/24. Patient was given appointment and address.[name] RN BSN

## 2024-04-02 NOTE — Progress Notes (Signed)
 Cardiac Individual Treatment Plan  Patient Details  Name: Joseph Hess MRN: [id number] Date of Birth: May 19, 1966 Referring Provider:   Flowsheet Row INTENSIVE CARDIAC REHAB ORIENT from 04/02/2024 in [facility]  Referring Provider Dr.Jay Ladona, MD    Initial Encounter Date:  Flowsheet Row INTENSIVE CARDIAC REHAB ORIENT from 04/02/2024 in [facility]  Date 04/02/24    Visit Diagnosis: 01/02/24 STEMI  01/02/24 S/P PCI, DES RCA  Patient's Home Medications on Admission:  Current Outpatient Medications:    aspirin  EC 81 MG tablet, Take 1 tablet (81 mg total) by mouth daily. Swallow whole., Disp: 90 tablet, Rfl: 3   atorvastatin  (LIPITOR) 80 MG tablet, Take 1 tablet (80 mg total) by mouth daily., Disp: 90 tablet, Rfl: 1   carvedilol  (COREG ) 3.125 MG tablet, Take 1 tablet (3.125 mg total) by mouth 2 (two) times daily with a meal., Disp: 180 tablet, Rfl: 2   digoxin  (LANOXIN ) 0.125 MG tablet, Take 1 tablet (0.125 mg total) by mouth daily., Disp: 90 tablet, Rfl: 3   nitroGLYCERIN  (NITROSTAT ) 0.4 MG SL tablet, Place 1 tablet (0.4 mg total) under the tongue every 5 (five) minutes x 3 doses as needed for chest pain., Disp: 25 tablet, Rfl: 2   prasugrel  (EFFIENT ) 10 MG TABS tablet, Take 1 tablet (10 mg total) by mouth daily., Disp: 90 tablet, Rfl: 3   sacubitril -valsartan  (ENTRESTO ) 49-51 MG, Take 1 tablet by mouth 2 (two) times daily., Disp: 60 tablet, Rfl: 3   spironolactone  (ALDACTONE ) 25 MG tablet, Take 1/2 tablet (12.5 mg total) by mouth daily., Disp: 45 tablet, Rfl: 3  Past Medical History: Past Medical History:  Diagnosis Date   Cavernous hemangioma of liver    Chronic pancreatitis (HCC)    Hx of adenomatous colonic polyps 03/30/2015   Pancreatic insufficiency     Tobacco Use: Social History   Tobacco Use  Smoking Status Every Day   Types: Cigarettes   Smokeless Tobacco Never  Tobacco Comments   Info given     Labs: Review Flowsheet       Latest Ref Rng & Units 09/17/2010 01/02/2024  Labs for ITP Cardiac and Pulmonary Rehab  Cholestrol 0 - 200 mg/dL - 824   LDL (calc) 0 - 99 mg/dL - 85   HDL-C >59 mg/dL - 78   Trlycerides <849 mg/dL - 60   Hemoglobin J8r 4.8 - 5.6 % 5.4 (NOTE)                                                                       According to the ADA Clinical Practice Recommendations for 2011, when HbA1c is used as a screening test:   >=6.5%   Diagnostic of Diabetes Mellitus           (if abnormal result  is confirmed)  5.7-6.4%   Increased risk of developing Diabetes Mellitus  References:Diagnosis and Classification of Diabetes Mellitus,Diabetes Care,2011,34(Suppl 1):S62-S69 and Standards of Medical Care in         Diabetes - 2011,Diabetes Care,2011,34  (Suppl 1):S11-S61.  6.9   TCO2 22 - 32 mmol/L - 20     Capillary Blood Glucose: Lab  Results  Component Value Date   GLUCAP 145 (H) 04/02/2024   GLUCAP 166 (H) 01/05/2024   GLUCAP 128 (H) 01/05/2024   GLUCAP 121 (H) 01/04/2024   GLUCAP 179 (H) 01/04/2024     Exercise Target Goals: Exercise Program Goal: Individual exercise prescription set using results from initial 6 min walk test and THRR while considering  patient's activity barriers and safety.   Exercise Prescription Goal: Initial exercise prescription builds to 30-45 minutes a day of aerobic activity, 2-3 days per week.  Home exercise guidelines will be given to patient during program as part of exercise prescription that the participant will acknowledge.  Activity Barriers & Risk Stratification:  Activity Barriers & Cardiac Risk Stratification - 04/02/24 0844       Activity Barriers & Cardiac Risk Stratification   Activity Barriers Deconditioning;Shortness of Breath;Balance Concerns    Cardiac Risk Stratification High          6 Minute Walk:  6 Minute Walk     Row Name 04/02/24 1046          6 Minute Walk   Phase Initial     Distance 1300 feet     Walk Time 6 minutes     # of Rest Breaks 0     MPH 2.46     METS 4.35     RPE 8     Perceived Dyspnea  1     VO2 Peak 15.22     Symptoms No     Resting HR 86 bpm     Resting BP 130/78     Resting Oxygen Saturation  99 %     Exercise Oxygen Saturation  during 6 min walk 97 %     Max Ex. HR 95 bpm     Max Ex. BP 150/90     2 Minute Post BP 140/80        Oxygen Initial Assessment:   Oxygen Re-Evaluation:   Oxygen Discharge (Final Oxygen Re-Evaluation):   Initial Exercise Prescription:  Initial Exercise Prescription - 04/02/24 1000       Date of Initial Exercise RX and Referring Provider   Date 04/02/24    Referring Provider Dr.Jay Ladona, MD    Expected Discharge Date 06/24/24      Bike   Level 2    Watts 35    Minutes 15    METs 2.2      NuStep   Level 2    SPM 75    Minutes 15    METs 2.2      Prescription Details   Frequency (times per week) 3    Duration Progress to 30 minutes of continuous aerobic without signs/symptoms of physical distress      Intensity   THRR 40-80% of Max Heartrate 66-131    Ratings of Perceived Exertion 11-13    Perceived Dyspnea 0-4      Progression   Progression Continue progressive overload as per policy without signs/symptoms or physical distress.      Resistance Training   Training Prescription Yes    Weight 3    Reps 10-15          Perform Capillary Blood Glucose checks as needed.  Exercise Prescription Changes:   Exercise Comments:   Exercise Goals and Review:   Exercise Goals     Row Name 04/02/24 0817             Exercise Goals   Increase Physical Activity Yes  Intervention Provide advice, education, support and counseling about physical activity/exercise needs.;Develop an individualized exercise prescription for aerobic and resistive training based on initial evaluation findings, risk stratification, comorbidities and  participant's personal goals.       Expected Outcomes Short Term: Attend rehab on a regular basis to increase amount of physical activity.;Long Term: Exercising regularly at least 3-5 days a week.;Long Term: Add in home exercise to make exercise part of routine and to increase amount of physical activity.       Increase Strength and Stamina Yes       Intervention Provide advice, education, support and counseling about physical activity/exercise needs.;Develop an individualized exercise prescription for aerobic and resistive training based on initial evaluation findings, risk stratification, comorbidities and participant's personal goals.       Expected Outcomes Short Term: Increase workloads from initial exercise prescription for resistance, speed, and METs.;Short Term: Perform resistance training exercises routinely during rehab and add in resistance training at home;Long Term: Improve cardiorespiratory fitness, muscular endurance and strength as measured by increased METs and functional capacity ( )       Able to understand and use rate of perceived exertion (RPE) scale Yes       Intervention Provide education and explanation on how to use RPE scale       Expected Outcomes Short Term: Able to use RPE daily in rehab to express subjective intensity level;Long Term:  Able to use RPE to guide intensity level when exercising independently       Knowledge and understanding of Target Heart Rate Range (THRR) Yes       Intervention Provide education and explanation of THRR including how the numbers were predicted and where they are located for reference       Expected Outcomes Short Term: Able to state/look up THRR;Long Term: Able to use THRR to govern intensity when exercising independently;Short Term: Able to use daily as guideline for intensity in rehab       Understanding of Exercise Prescription Yes       Intervention Provide education, explanation, and written materials on patient's individual exercise  prescription       Expected Outcomes Short Term: Able to explain program exercise prescription;Long Term: Able to explain home exercise prescription to exercise independently          Exercise Goals Re-Evaluation :   Discharge Exercise Prescription (Final Exercise Prescription Changes):   Nutrition:  Target Goals: Understanding of nutrition guidelines, daily intake of sodium 1500mg , cholesterol 200mg , calories 30% from fat and 7% or less from saturated fats, daily to have 5 or more servings of fruits and vegetables.  Biometrics:  Pre Biometrics - 04/02/24 0842       Pre Biometrics   Waist Circumference 31 inches    Hip Circumference 35 inches    Waist to Hip Ratio 0.89 %    Triceps Skinfold 6 mm    % Body Fat 16 %    Grip Strength 30 kg    Flexibility 14 in    Single Leg Stand 15 seconds           Nutrition Therapy Plan and Nutrition Goals:   Nutrition Assessments:  Nutrition Assessments - 04/02/24 1053       Rate Your Plate Scores   Pre Score 40         MEDIFICTS Score Key: >=70 Need to make dietary changes  40-70 Heart Healthy Diet <= 40 Therapeutic Level Cholesterol Diet   Flowsheet Row INTENSIVE CARDIAC REHAB  ORIENT from 04/02/2024 in [facility]  Picture Your Plate Total Score on Admission 40   Picture Your Plate Scores: <59 Unhealthy dietary pattern with much room for improvement. 41-50 Dietary pattern unlikely to meet recommendations for good health and room for improvement. 51-60 More healthful dietary pattern, with some room for improvement.  >60 Healthy dietary pattern, although there may be some specific behaviors that could be improved.    Nutrition Goals Re-Evaluation:   Nutrition Goals Re-Evaluation:   Nutrition Goals Discharge (Final Nutrition Goals Re-Evaluation):   Psychosocial: Target Goals: Acknowledge presence or absence of significant depression and/or stress,  maximize coping skills, provide positive support system. Participant is able to verbalize types and ability to use techniques and skills needed for reducing stress and depression.  Initial Review & Psychosocial Screening:  Initial Psych Review & Screening - 04/02/24 0830       Initial Review   Current issues with None Identified      Family Dynamics   Good Support System? Yes   wife and Mom     Barriers   Psychosocial barriers to participate in program There are no identifiable barriers or psychosocial needs.      Screening Interventions   Interventions Encouraged to exercise          Quality of Life Scores:  Quality of Life - 04/02/24 0859       Quality of Life   Select Quality of Life      Quality of Life Scores   Health/Function Pre 27.21 %    Socioeconomic Pre 18.71 %    Psych/Spiritual Pre 30 %    Family Pre 30 %    GLOBAL Pre 26.42 %         Scores of 19 and below usually indicate a poorer quality of life in these areas.  A difference of  2-3 points is a clinically meaningful difference.  A difference of 2-3 points in the total score of the Quality of Life Index has been associated with significant improvement in overall quality of life, self-image, physical symptoms, and general health in studies assessing change in quality of life.  PHQ-9: Review Flowsheet       04/02/2024 12/30/2014 12/19/2014 12/17/2014  Depression screen PHQ 2/9  Decreased Interest 0 0 0 0  Down, Depressed, Hopeless 0 0 0 0  PHQ - 2 Score 0 0 0 0  Altered sleeping 0 - - -  Tired, decreased energy 1 - - -  Change in appetite 0 - - -  Feeling bad or failure about yourself  0 - - -  Trouble concentrating 0 - - -  Moving slowly or fidgety/restless 0 - - -  Suicidal thoughts 0 - - -  PHQ-9 Score 1 - - -  Difficult doing work/chores Not difficult at all - - -   Interpretation of Total Score  Total Score Depression Severity:  1-4 = Minimal depression, 5-9 = Mild depression, 10-14 =  Moderate depression, 15-19 = Moderately severe depression, 20-27 = Severe depression   Psychosocial Evaluation and Intervention:   Psychosocial Re-Evaluation:   Psychosocial Discharge (Final Psychosocial Re-Evaluation):   Vocational Rehabilitation: Provide vocational rehab assistance to qualifying candidates.   Vocational Rehab Evaluation & Intervention:  Vocational Rehab - 04/02/24 0830       Initial Vocational Rehab Evaluation & Intervention   Assessment shows need for Vocational Rehabilitation No   Loys is not working currently due his  recent cardiac event. Fabrice hopes to be cleared to return to his current job         Education: Education Goals: Education classes will be provided on a weekly basis, covering required topics. Participant will state understanding/return demonstration of topics presented.     Core Videos: Exercise    Move It!  Clinical staff conducted group or individual video education with verbal and written material and guidebook.  Patient learns the recommended Pritikin exercise program. Exercise with the goal of living a long, healthy life. Some of the health benefits of exercise include controlled diabetes, healthier blood pressure levels, improved cholesterol levels, improved heart and lung capacity, improved sleep, and better body composition. Everyone should speak with their doctor before starting or changing an exercise routine.  Biomechanical Limitations Clinical staff conducted group or individual video education with verbal and written material and guidebook.  Patient learns how biomechanical limitations can impact exercise and how we can mitigate and possibly overcome limitations to have an impactful and balanced exercise routine.  Body Composition Clinical staff conducted group or individual video education with verbal and written material and guidebook.  Patient learns that body composition (ratio of muscle mass to fat mass) is a key  component to assessing overall fitness, rather than body weight alone. Increased fat mass, especially visceral belly fat, can put us  at increased risk for metabolic syndrome, type 2 diabetes, heart disease, and even death. It is recommended to combine diet and exercise (cardiovascular and resistance training) to improve your body composition. Seek guidance from your physician and exercise physiologist before implementing an exercise routine.  Exercise Action Plan Clinical staff conducted group or individual video education with verbal and written material and guidebook.  Patient learns the recommended strategies to achieve and enjoy long-term exercise adherence, including variety, self-motivation, self-efficacy, and positive decision making. Benefits of exercise include fitness, good health, weight management, more energy, better sleep, less stress, and overall well-being.  Medical   Heart Disease Risk Reduction Clinical staff conducted group or individual video education with verbal and written material and guidebook.  Patient learns our heart is our most vital organ as it circulates oxygen, nutrients, white blood cells, and hormones throughout the entire body, and carries waste away. Data supports a plant-based eating plan like the Pritikin Program for its effectiveness in slowing progression of and reversing heart disease. The video provides a number of recommendations to address heart disease.   Metabolic Syndrome and Belly Fat  Clinical staff conducted group or individual video education with verbal and written material and guidebook.  Patient learns what metabolic syndrome is, how it leads to heart disease, and how one can reverse it and keep it from coming back. You have metabolic syndrome if you have 3 of the following 5 criteria: abdominal obesity, high blood pressure, high triglycerides, low HDL cholesterol, and high blood sugar.  Hypertension and Heart Disease Clinical staff conducted  group or individual video education with verbal and written material and guidebook.  Patient learns that high blood pressure, or hypertension, is very common in the [place] . Hypertension is largely due to excessive salt intake, but other important risk factors include being overweight, physical inactivity, drinking too much alcohol, smoking, and not eating enough potassium from fruits and vegetables. High blood pressure is a leading risk factor for heart attack, stroke, congestive heart failure, dementia, kidney failure, and premature death. Long-term effects of excessive salt intake include stiffening of the arteries and thickening of heart muscle and organ damage.  Recommendations include ways to reduce hypertension and the risk of heart disease.  Diseases of Our Time - Focusing on Diabetes Clinical staff conducted group or individual video education with verbal and written material and guidebook.  Patient learns why the best way to stop diseases of our time is prevention, through food and other lifestyle changes. Medicine (such as prescription pills and surgeries) is often only a Band-Aid on the problem, not a long-term solution. Most common diseases of our time include obesity, type 2 diabetes, hypertension, heart disease, and cancer. The Pritikin Program is recommended and has been proven to help reduce, reverse, and/or prevent the damaging effects of metabolic syndrome.  Nutrition   Overview of the Pritikin Eating Plan  Clinical staff conducted group or individual video education with verbal and written material and guidebook.  Patient learns about the Pritikin Eating Plan for disease risk reduction. The Pritikin Eating Plan emphasizes a wide variety of unrefined, minimally-processed carbohydrates, like fruits, vegetables, whole grains, and legumes. Go, Caution, and Stop food choices are explained. Plant-based and lean animal proteins are emphasized. Rationale provided for low sodium intake for  blood pressure control, low added sugars for blood sugar stabilization, and low added fats and oils for coronary artery disease risk reduction and weight management.  Calorie Density  Clinical staff conducted group or individual video education with verbal and written material and guidebook.  Patient learns about calorie density and how it impacts the Pritikin Eating Plan. Knowing the characteristics of the food you choose will help you decide whether those foods will lead to weight gain or weight loss, and whether you want to consume more or less of them. Weight loss is usually a side effect of the Pritikin Eating Plan because of its focus on low calorie-dense foods.  Label Reading  Clinical staff conducted group or individual video education with verbal and written material and guidebook.  Patient learns about the Pritikin recommended label reading guidelines and corresponding recommendations regarding calorie density, added sugars, sodium content, and whole grains.  Dining Out - Part 1  Clinical staff conducted group or individual video education with verbal and written material and guidebook.  Patient learns that restaurant meals can be sabotaging because they can be so high in calories, fat, sodium, and/or sugar. Patient learns recommended strategies on how to positively address this and avoid unhealthy pitfalls.  Facts on Fats  Clinical staff conducted group or individual video education with verbal and written material and guidebook.  Patient learns that lifestyle modifications can be just as effective, if not more so, as many medications for lowering your risk of heart disease. A Pritikin lifestyle can help to reduce your risk of inflammation and atherosclerosis (cholesterol build-up, or plaque, in the artery walls). Lifestyle interventions such as dietary choices and physical activity address the cause of atherosclerosis. A review of the types of fats and their impact on blood cholesterol  levels, along with dietary recommendations to reduce fat intake is also included.  Nutrition Action Plan  Clinical staff conducted group or individual video education with verbal and written material and guidebook.  Patient learns how to incorporate Pritikin recommendations into their lifestyle. Recommendations include planning and keeping personal health goals in mind as an important part of their success.  Healthy Mind-Set    Healthy Minds, Bodies, Hearts  Clinical staff conducted group or individual video education with verbal and written material and guidebook.  Patient learns how to identify when they are stressed. Video will discuss the impact of  that stress, as well as the many benefits of stress management. Patient will also be introduced to stress management techniques. The way we think, act, and feel has an impact on our hearts.  How Our Thoughts Can Heal Our Hearts  Clinical staff conducted group or individual video education with verbal and written material and guidebook.  Patient learns that negative thoughts can cause depression and anxiety. This can result in negative lifestyle behavior and serious health problems. Cognitive behavioral therapy is an effective method to help control our thoughts in order to change and improve our emotional outlook.  Additional Videos:  Exercise    Improving Performance  Clinical staff conducted group or individual video education with verbal and written material and guidebook.  Patient learns to use a non-linear approach by alternating intensity levels and lengths of time spent exercising to help burn more calories and lose more body fat. Cardiovascular exercise helps improve heart health, metabolism, hormonal balance, blood sugar control, and recovery from fatigue. Resistance training improves strength, endurance, balance, coordination, reaction time, metabolism, and muscle mass. Flexibility exercise improves circulation, posture, and balance. Seek  guidance from your physician and exercise physiologist before implementing an exercise routine and learn your capabilities and proper form for all exercise.  Introduction to Yoga  Clinical staff conducted group or individual video education with verbal and written material and guidebook.  Patient learns about yoga, a discipline of the coming together of mind, breath, and body. The benefits of yoga include improved flexibility, improved range of motion, better posture and core strength, increased lung function, weight loss, and positive self-image. Yoga's heart health benefits include lowered blood pressure, healthier heart rate, decreased cholesterol and triglyceride levels, improved immune function, and reduced stress. Seek guidance from your physician and exercise physiologist before implementing an exercise routine and learn your capabilities and proper form for all exercise.  Medical   Aging: Enhancing Your Quality of Life  Clinical staff conducted group or individual video education with verbal and written material and guidebook.  Patient learns key strategies and recommendations to stay in good physical health and enhance quality of life, such as prevention strategies, having an advocate, securing a Health Care Proxy and Power of Attorney, and keeping a list of medications and system for tracking them. It also discusses how to avoid risk for bone loss.  Biology of Weight Control  Clinical staff conducted group or individual video education with verbal and written material and guidebook.  Patient learns that weight gain occurs because we consume more calories than we burn (eating more, moving less). Even if your body weight is normal, you may have higher ratios of fat compared to muscle mass. Too much body fat puts you at increased risk for cardiovascular disease, heart attack, stroke, type 2 diabetes, and obesity-related cancers. In addition to exercise, following the Pritikin Eating Plan can help  reduce your risk.  Decoding Lab Results  Clinical staff conducted group or individual video education with verbal and written material and guidebook.  Patient learns that lab test reflects one measurement whose values change over time and are influenced by many factors, including medication, stress, sleep, exercise, food, hydration, pre-existing medical conditions, and more. It is recommended to use the knowledge from this video to become more involved with your lab results and evaluate your numbers to speak with your doctor.   Diseases of Our Time - Overview  Clinical staff conducted group or individual video education with verbal and written material and guidebook.  Patient learns  that according to the CDC, 50% to 70% of chronic diseases (such as obesity, type 2 diabetes, elevated lipids, hypertension, and heart disease) are avoidable through lifestyle improvements including healthier food choices, listening to satiety cues, and increased physical activity.  Sleep Disorders Clinical staff conducted group or individual video education with verbal and written material and guidebook.  Patient learns how good quality and duration of sleep are important to overall health and well-being. Patient also learns about sleep disorders and how they impact health along with recommendations to address them, including discussing with a physician.  Nutrition  Dining Out - Part 2 Clinical staff conducted group or individual video education with verbal and written material and guidebook.  Patient learns how to plan ahead and communicate in order to maximize their dining experience in a healthy and nutritious manner. Included are recommended food choices based on the type of restaurant the patient is visiting.   Fueling a Banker conducted group or individual video education with verbal and written material and guidebook.  There is a strong connection between our food choices and our health.  Diseases like obesity and type 2 diabetes are very prevalent and are in large-part due to lifestyle choices. The Pritikin Eating Plan provides plenty of food and hunger-curbing satisfaction. It is easy to follow, affordable, and helps reduce health risks.  Menu Workshop  Clinical staff conducted group or individual video education with verbal and written material and guidebook.  Patient learns that restaurant meals can sabotage health goals because they are often packed with calories, fat, sodium, and sugar. Recommendations include strategies to plan ahead and to communicate with the manager, chef, or server to help order a healthier meal.  Planning Your Eating Strategy  Clinical staff conducted group or individual video education with verbal and written material and guidebook.  Patient learns about the Pritikin Eating Plan and its benefit of reducing the risk of disease. The Pritikin Eating Plan does not focus on calories. Instead, it emphasizes high-quality, nutrient-rich foods. By knowing the characteristics of the foods, we choose, we can determine their calorie density and make informed decisions.  Targeting Your Nutrition Priorities  Clinical staff conducted group or individual video education with verbal and written material and guidebook.  Patient learns that lifestyle habits have a tremendous impact on disease risk and progression. This video provides eating and physical activity recommendations based on your personal health goals, such as reducing LDL cholesterol, losing weight, preventing or controlling type 2 diabetes, and reducing high blood pressure.  Vitamins and Minerals  Clinical staff conducted group or individual video education with verbal and written material and guidebook.  Patient learns different ways to obtain key vitamins and minerals, including through a recommended healthy diet. It is important to discuss all supplements you take with your doctor.   Healthy Mind-Set     Smoking Cessation  Clinical staff conducted group or individual video education with verbal and written material and guidebook.  Patient learns that cigarette smoking and tobacco addiction pose a serious health risk which affects millions of people. Stopping smoking will significantly reduce the risk of heart disease, lung disease, and many forms of cancer. Recommended strategies for quitting are covered, including working with your doctor to develop a successful plan.  Culinary   Becoming a Set Designer conducted group or individual video education with verbal and written material and guidebook.  Patient learns that cooking at home can be healthy, cost-effective, quick, and puts them  in control. Keys to cooking healthy recipes will include looking at your recipe, assessing your equipment needs, planning ahead, making it simple, choosing cost-effective seasonal ingredients, and limiting the use of added fats, salts, and sugars.  Cooking - Breakfast and Snacks  Clinical staff conducted group or individual video education with verbal and written material and guidebook.  Patient learns how important breakfast is to satiety and nutrition through the entire day. Recommendations include key foods to eat during breakfast to help stabilize blood sugar levels and to prevent overeating at meals later in the day. Planning ahead is also a key component.  Cooking - Educational Psychologist conducted group or individual video education with verbal and written material and guidebook.  Patient learns eating strategies to improve overall health, including an approach to cook more at home. Recommendations include thinking of animal protein as a side on your plate rather than center stage and focusing instead on lower calorie dense options like vegetables, fruits, whole grains, and plant-based proteins, such as beans. Making sauces in large quantities to freeze for later and leaving the skin  on your vegetables are also recommended to maximize your experience.  Cooking - Healthy Salads and Dressing Clinical staff conducted group or individual video education with verbal and written material and guidebook.  Patient learns that vegetables, fruits, whole grains, and legumes are the foundations of the Pritikin Eating Plan. Recommendations include how to incorporate each of these in flavorful and healthy salads, and how to create homemade salad dressings. Proper handling of ingredients is also covered. Cooking - Soups and [employer] - Soups and Desserts Clinical staff conducted group or individual video education with verbal and written material and guidebook.  Patient learns that Pritikin soups and desserts make for easy, nutritious, and delicious snacks and meal components that are low in sodium, fat, sugar, and calorie density, while high in vitamins, minerals, and filling fiber. Recommendations include simple and healthy ideas for soups and desserts.   Overview     The Pritikin Solution Program Overview Clinical staff conducted group or individual video education with verbal and written material and guidebook.  Patient learns that the results of the Pritikin Program have been documented in more than 100 articles published in peer-reviewed journals, and the benefits include reducing risk factors for (and, in some cases, even reversing) high cholesterol, high blood pressure, type 2 diabetes, obesity, and more! An overview of the three key pillars of the Pritikin Program will be covered: eating well, doing regular exercise, and having a healthy mind-set.  WORKSHOPS  Exercise: Exercise Basics: Building Your Action Plan Clinical staff led group instruction and group discussion with PowerPoint presentation and patient guidebook. To enhance the learning environment the use of posters, models and videos may be added. At the conclusion of this workshop, patients will comprehend the  difference between physical activity and exercise, as well as the benefits of incorporating both, into their routine. Patients will understand the FITT (Frequency, Intensity, Time, and Type) principle and how to use it to build an exercise action plan. In addition, safety concerns and other considerations for exercise and cardiac rehab will be addressed by the presenter. The purpose of this lesson is to promote a comprehensive and effective weekly exercise routine in order to improve patients' overall level of fitness.   Managing Heart Disease: Your Path to a Healthier Heart Clinical staff led group instruction and group discussion with PowerPoint presentation and patient guidebook. To enhance the learning environment  the use of posters, models and videos may be added.At the conclusion of this workshop, patients will understand the anatomy and physiology of the heart. Additionally, they will understand how Pritikin's three pillars impact the risk factors, the progression, and the management of heart disease.  The purpose of this lesson is to provide a high-level overview of the heart, heart disease, and how the Pritikin lifestyle positively impacts risk factors.  Exercise Biomechanics Clinical staff led group instruction and group discussion with PowerPoint presentation and patient guidebook. To enhance the learning environment the use of posters, models and videos may be added. Patients will learn how the structural parts of their bodies function and how these functions impact their daily activities, movement, and exercise. Patients will learn how to promote a neutral spine, learn how to manage pain, and identify ways to improve their physical movement in order to promote healthy living. The purpose of this lesson is to expose patients to common physical limitations that impact physical activity. Participants will learn practical ways to adapt and manage aches and pains, and to minimize their  effect on regular exercise. Patients will learn how to maintain good posture while sitting, walking, and lifting.  Balance Training and Fall Prevention  Clinical staff led group instruction and group discussion with PowerPoint presentation and patient guidebook. To enhance the learning environment the use of posters, models and videos may be added. At the conclusion of this workshop, patients will understand the importance of their sensorimotor skills (vision, proprioception, and the vestibular system) in maintaining their ability to balance as they age. Patients will apply a variety of balancing exercises that are appropriate for their current level of function. Patients will understand the common causes for poor balance, possible solutions to these problems, and ways to modify their physical environment in order to minimize their fall risk. The purpose of this lesson is to teach patients about the importance of maintaining balance as they age and ways to minimize their risk of falling.  WORKSHOPS   Nutrition:  Fueling a Ship Broker led group instruction and group discussion with PowerPoint presentation and patient guidebook. To enhance the learning environment the use of posters, models and videos may be added. Patients will review the foundational principles of the Pritikin Eating Plan and understand what constitutes a serving size in each of the food groups. Patients will also learn Pritikin-friendly foods that are better choices when away from home and review make-ahead meal and snack options. Calorie density will be reviewed and applied to three nutrition priorities: weight maintenance, weight loss, and weight gain. The purpose of this lesson is to reinforce (in a group setting) the key concepts around what patients are recommended to eat and how to apply these guidelines when away from home by planning and selecting Pritikin-friendly options. Patients will understand how calorie  density may be adjusted for different weight management goals.  Mindful Eating  Clinical staff led group instruction and group discussion with PowerPoint presentation and patient guidebook. To enhance the learning environment the use of posters, models and videos may be added. Patients will briefly review the concepts of the Pritikin Eating Plan and the importance of low-calorie dense foods. The concept of mindful eating will be introduced as well as the importance of paying attention to internal hunger signals. Triggers for non-hunger eating and techniques for dealing with triggers will be explored. The purpose of this lesson is to provide patients with the opportunity to review the basic principles of the Pritikin  Eating Plan, discuss the value of eating mindfully and how to measure internal cues of hunger and fullness using the Hunger Scale. Patients will also discuss reasons for non-hunger eating and learn strategies to use for controlling emotional eating.  Targeting Your Nutrition Priorities Clinical staff led group instruction and group discussion with PowerPoint presentation and patient guidebook. To enhance the learning environment the use of posters, models and videos may be added. Patients will learn how to determine their genetic susceptibility to disease by reviewing their family history. Patients will gain insight into the importance of diet as part of an overall healthy lifestyle in mitigating the impact of genetics and other environmental insults. The purpose of this lesson is to provide patients with the opportunity to assess their personal nutrition priorities by looking at their family history, their own health history and current risk factors. Patients will also be able to discuss ways of prioritizing and modifying the Pritikin Eating Plan for their highest risk areas  Menu  Clinical staff led group instruction and group discussion with PowerPoint presentation and patient guidebook. To  enhance the learning environment the use of posters, models and videos may be added. Using menus brought in from [employer], or printed from [employer], patients will apply the Pritikin dining out guidelines that were presented in the [employer] video. Patients will also be able to practice these guidelines in a variety of provided scenarios. The purpose of this lesson is to provide patients with the opportunity to practice hands-on learning of the Pritikin Dining Out guidelines with actual menus and practice scenarios.  Label Reading Clinical staff led group instruction and group discussion with PowerPoint presentation and patient guidebook. To enhance the learning environment the use of posters, models and videos may be added. Patients will review and discuss the Pritikin label reading guidelines presented in Pritikin's Label Reading Educational series video. Using fool labels brought in from local grocery stores and markets, patients will apply the label reading guidelines and determine if the packaged food meet the Pritikin guidelines. The purpose of this lesson is to provide patients with the opportunity to review, discuss, and practice hands-on learning of the Pritikin Label Reading guidelines with actual packaged food labels. Cooking School  Pritikin's [employer] are designed to teach patients ways to prepare quick, simple, and affordable recipes at home. The importance of nutrition's role in chronic disease risk reduction is reflected in its emphasis in the overall Pritikin program. By learning how to prepare essential core Pritikin Eating Plan recipes, patients will increase control over what they eat; be able to customize the flavor of foods without the use of added salt, sugar, or fat; and improve the quality of the food they consume. By learning a set of core recipes which are easily assembled, quickly prepared, and affordable, patients are more likely to  prepare more healthy foods at home. These workshops focus on convenient breakfasts, simple entres, side dishes, and desserts which can be prepared with minimal effort and are consistent with nutrition recommendations for cardiovascular risk reduction. Cooking [employer] are taught by a armed forces logistics/support/administrative officer (RD) who has been trained by the [employer]. The chef or RD has a clear understanding of the importance of minimizing - if not completely eliminating - added fat, sugar, and sodium in recipes. Throughout the series of Cooking School Workshop sessions, patients will learn about healthy ingredients and efficient methods of cooking to build confidence in their capability to prepare  Cooking School weekly topics:  Adding Flavor- Sodium-Free  Fast and Healthy Breakfasts  Powerhouse Plant-Based Proteins  Satisfying Salads and Dressings  Simple Sides and Sauces  International Cuisine-Spotlight on the [employer] Zones  Delicious Desserts  Savory Soups  Efficiency Cooking - Meals in a Snap  Tasty Appetizers and Snacks  Comforting Weekend Breakfasts  One-Pot Wonders   Fast Evening Meals  Landscape Architect Your Pritikin Plate  WORKSHOPS   Healthy Mindset (Psychosocial):  Focused Goals, Sustainable Changes Clinical staff led group instruction and group discussion with PowerPoint presentation and patient guidebook. To enhance the learning environment the use of posters, models and videos may be added. Patients will be able to apply effective goal setting strategies to establish at least one personal goal, and then take consistent, meaningful action toward that goal. They will learn to identify common barriers to achieving personal goals and develop strategies to overcome them. Patients will also gain an understanding of how our mind-set can impact our ability to achieve goals and the importance of cultivating a positive and growth-oriented mind-set. The purpose  of this lesson is to provide patients with a deeper understanding of how to set and achieve personal goals, as well as the tools and strategies needed to overcome common obstacles which may arise along the way.  From Head to Heart: The Power of a Healthy Outlook  Clinical staff led group instruction and group discussion with PowerPoint presentation and patient guidebook. To enhance the learning environment the use of posters, models and videos may be added. Patients will be able to recognize and describe the impact of emotions and mood on physical health. They will discover the importance of self-care and explore self-care practices which may work for them. Patients will also learn how to utilize the 4 C's to cultivate a healthier outlook and better manage stress and challenges. The purpose of this lesson is to demonstrate to patients how a healthy outlook is an essential part of maintaining good health, especially as they continue their cardiac rehab journey.  Healthy Sleep for a Healthy Heart Clinical staff led group instruction and group discussion with PowerPoint presentation and patient guidebook. To enhance the learning environment the use of posters, models and videos may be added. At the conclusion of this workshop, patients will be able to demonstrate knowledge of the importance of sleep to overall health, well-being, and quality of life. They will understand the symptoms of, and treatments for, common sleep disorders. Patients will also be able to identify daytime and nighttime behaviors which impact sleep, and they will be able to apply these tools to help manage sleep-related challenges. The purpose of this lesson is to provide patients with a general overview of sleep and outline the importance of quality sleep. Patients will learn about a few of the most common sleep disorders. Patients will also be introduced to the concept of "sleep hygiene," and discover ways to self-manage certain sleeping  problems through simple daily behavior changes. Finally, the workshop will motivate patients by clarifying the links between quality sleep and their goals of heart-healthy living.   Recognizing and Reducing Stress Clinical staff led group instruction and group discussion with PowerPoint presentation and patient guidebook. To enhance the learning environment the use of posters, models and videos may be added. At the conclusion of this workshop, patients will be able to understand the types of stress reactions, differentiate between acute and chronic stress, and recognize the impact that chronic stress has on their health. They will  also be able to apply different coping mechanisms, such as reframing negative self-talk. Patients will have the opportunity to practice a variety of stress management techniques, such as deep abdominal breathing, progressive muscle relaxation, and/or guided imagery.  The purpose of this lesson is to educate patients on the role of stress in their lives and to provide healthy techniques for coping with it.  Learning Barriers/Preferences:  Learning Barriers/Preferences - 04/02/24 0845       Learning Barriers/Preferences   Learning Barriers Exercise Concerns   Sometimes dizzy   Learning Preferences Computer/Internet;Group Instruction;Individual Instruction;Pictoral;Skilled Demonstration;Video;Written Material          Education Topics:  Knowledge Questionnaire Score:  Knowledge Questionnaire Score - 04/02/24 0854       Knowledge Questionnaire Score   Pre Score 22/28          Core Components/Risk Factors/Patient Goals at Admission:  Personal Goals and Risk Factors at Admission - 04/02/24 0848       Core Components/Risk Factors/Patient Goals on Admission    Weight Management Yes;Weight Gain    Intervention Weight Management: Develop a combined nutrition and exercise program designed to reach desired caloric intake, while maintaining appropriate intake of  nutrient and fiber, sodium and fats, and appropriate energy expenditure required for the weight goal.;Weight Management: Provide education and appropriate resources to help participant work on and attain dietary goals.    Admit Weight 134 lb 14.7 oz (61.2 kg)    Expected Outcomes Short Term: Continue to assess and modify interventions until short term weight is achieved;Long Term: Adherence to nutrition and physical activity/exercise program aimed toward attainment of established weight goal;Weight Loss: Understanding of general recommendations for a balanced deficit meal plan, which promotes 1-2 lb weight loss per week and includes a negative energy balance of [phone] kcal/d;Understanding recommendations for meals to include 15-35% energy as protein, 25-35% energy from fat, 35-60% energy from carbohydrates, less than 200mg  of dietary cholesterol, 20-35 gm of total fiber daily;Understanding of distribution of calorie intake throughout the day with the consumption of 4-5 meals/snacks    Improve shortness of breath with ADL's Yes    Intervention Provide education, individualized exercise plan and daily activity instruction to help decrease symptoms of SOB with activities of daily living.    Expected Outcomes Short Term: Improve cardiorespiratory fitness to achieve a reduction of symptoms when performing ADLs;Long Term: Be able to perform more ADLs without symptoms or delay the onset of symptoms    Hypertension Yes    Intervention Provide education on lifestyle modifcations including regular physical activity/exercise, weight management, moderate sodium restriction and increased consumption of fresh fruit, vegetables, and low fat dairy, alcohol moderation, and smoking cessation.;Monitor prescription use compliance.    Expected Outcomes Short Term: Continued assessment and intervention until BP is < 140/68mm HG in hypertensive participants. < 130/97mm HG in hypertensive participants with diabetes, heart failure  or chronic kidney disease.;Long Term: Maintenance of blood pressure at goal levels.    Stress Yes    Intervention Offer individual and/or small group education and counseling on adjustment to heart disease, stress management and health-related lifestyle change. Teach and support self-help strategies.;Refer participants experiencing significant psychosocial distress to appropriate mental health specialists for further evaluation and treatment. When possible, include family members and significant others in education/counseling sessions.    Expected Outcomes Short Term: Participant demonstrates changes in health-related behavior, relaxation and other stress management skills, ability to obtain effective social support, and compliance with psychotropic medications if prescribed.;Long Term: Emotional wellbeing is  indicated by absence of clinically significant psychosocial distress or social isolation.    Personal Goal Other Yes    Personal Goal ST: eat better, more veggies LT weight gain, control SOB, increase strength    Intervention Will continue to monitor pt and increase WL's as tolerated without sign or symptom    Expected Outcomes Pt will achieve his goals          Core Components/Risk Factors/Patient Goals Review:    Core Components/Risk Factors/Patient Goals at Discharge (Final Review):    ITP Comments:  ITP Comments     Row Name 03/24/24 0923 04/02/24 0815         ITP Comments Dr Wilbert Bihari MD, Medical Director Dr. Wilbert Bihari medical director. Introduction to pritikin education/intensive cardiac rehab. Initial orientation packet reviewed with patient.         Comments: Participant attended orientation for the cardiac rehabilitation program on  04/02/2024  to perform initial intake and exercise walk test. Patient introduced to the Pritikin Program education and orientation packet was reviewed. Completed 6-minute walk test, measurements, initial ITP, and exercise prescription.  Vital signs stable. Telemetry-normal sinus rhythm with PVC's, mildly symptomatic SOB, resolves with rest.  Service time was from 0817 to 1020.

## 2024-04-03 ENCOUNTER — Encounter (HOSPITAL_COMMUNITY)

## 2024-04-06 ENCOUNTER — Encounter (HOSPITAL_COMMUNITY)

## 2024-04-07 ENCOUNTER — Other Ambulatory Visit (HOSPITAL_COMMUNITY)

## 2024-04-07 ENCOUNTER — Other Ambulatory Visit (HOSPITAL_COMMUNITY): Payer: Self-pay

## 2024-04-07 ENCOUNTER — Encounter (HOSPITAL_COMMUNITY): Admitting: Cardiology

## 2024-04-07 ENCOUNTER — Telehealth (HOSPITAL_COMMUNITY): Payer: Self-pay

## 2024-04-07 NOTE — Telephone Encounter (Signed)
 Called and spoke to pt's mother Dickey to confirm/remind patient of their appointment at the [facility] on 04/08/24.   Appointment:   [x] Confirmed  [] Left mess   [] No answer/No voice mail  [] VM Full/unable to leave message  [] Phone not in service  Patient reminded to bring all medications and/or complete list.  Confirmed patient has transportation. Gave directions, instructed to utilize valet parking.

## 2024-04-08 ENCOUNTER — Other Ambulatory Visit (HOSPITAL_COMMUNITY): Payer: Self-pay

## 2024-04-08 ENCOUNTER — Ambulatory Visit (HOSPITAL_COMMUNITY): Payer: Self-pay | Admitting: Family Medicine

## 2024-04-08 ENCOUNTER — Ambulatory Visit (HOSPITAL_COMMUNITY)
Admission: RE | Admit: 2024-04-08 | Discharge: 2024-04-08 | Disposition: A | Discharge status: Home / Self Care | Source: Ambulatory Visit | Attending: Family Medicine | Admitting: Family Medicine

## 2024-04-08 ENCOUNTER — Encounter (HOSPITAL_COMMUNITY): Payer: Self-pay

## 2024-04-08 VITALS — BP 128/82 | HR 103 | Wt 132.6 lb

## 2024-04-08 DIAGNOSIS — Z955 Presence of coronary angioplasty implant and graft: Secondary | ICD-10-CM | POA: Diagnosis not present

## 2024-04-08 DIAGNOSIS — I2510 Atherosclerotic heart disease of native coronary artery without angina pectoris: Secondary | ICD-10-CM | POA: Diagnosis not present

## 2024-04-08 DIAGNOSIS — F1910 Other psychoactive substance abuse, uncomplicated: Secondary | ICD-10-CM | POA: Diagnosis not present

## 2024-04-08 DIAGNOSIS — I5022 Chronic systolic (congestive) heart failure: Secondary | ICD-10-CM | POA: Diagnosis not present

## 2024-04-08 DIAGNOSIS — I252 Old myocardial infarction: Secondary | ICD-10-CM | POA: Diagnosis present

## 2024-04-08 DIAGNOSIS — F1010 Alcohol abuse, uncomplicated: Secondary | ICD-10-CM | POA: Insufficient documentation

## 2024-04-08 DIAGNOSIS — Z79899 Other long term (current) drug therapy: Secondary | ICD-10-CM | POA: Insufficient documentation

## 2024-04-08 DIAGNOSIS — Z139 Encounter for screening, unspecified: Secondary | ICD-10-CM

## 2024-04-08 DIAGNOSIS — I110 Hypertensive heart disease with heart failure: Secondary | ICD-10-CM | POA: Insufficient documentation

## 2024-04-08 DIAGNOSIS — F17210 Nicotine dependence, cigarettes, uncomplicated: Secondary | ICD-10-CM | POA: Diagnosis not present

## 2024-04-08 DIAGNOSIS — I493 Ventricular premature depolarization: Secondary | ICD-10-CM | POA: Diagnosis not present

## 2024-04-08 DIAGNOSIS — F1210 Cannabis abuse, uncomplicated: Secondary | ICD-10-CM | POA: Insufficient documentation

## 2024-04-08 DIAGNOSIS — I10 Essential (primary) hypertension: Secondary | ICD-10-CM

## 2024-04-08 LAB — BASIC METABOLIC PANEL WITH GFR
Anion gap: 13 (ref 5–15)
BUN: 15 mg/dL (ref 6–20)
CO2: 18 mmol/L — ABNORMAL LOW (ref 22–32)
Calcium: 9.1 mg/dL (ref 8.9–10.3)
Chloride: 106 mmol/L (ref 98–111)
Creatinine, Ser: 0.99 mg/dL (ref 0.61–1.24)
GFR, Estimated: >60 mL/min (ref >60)
Glucose, Bld: 87 mg/dL (ref 70–99)
Potassium: 3.9 mmol/L (ref 3.5–5.1)
Sodium: 137 mmol/L (ref 135–145)

## 2024-04-08 LAB — BRAIN NATRIURETIC PEPTIDE: B Natriuretic Peptide: 200.7 pg/mL — ABNORMAL HIGH (ref 0.0–100.0)

## 2024-04-08 MED ORDER — SACUBITRIL-VALSARTAN 24-26 MG PO TABS
1.0000 | ORAL_TABLET | Freq: Two times a day (BID) | ORAL | 11 refills | Status: AC
  Filled 2024-04-08: qty 60, 30d supply, fill #0

## 2024-04-08 NOTE — Patient Instructions (Addendum)
 Good to see you today!    PLEASE take coreg  1 tablet (3.125 mg ) Twice daily  STOP losartan    START  Entresto  24/26 mg Twice daily   Labs done today, your results will be available in MyChart, we will contact you for abnormal readings.  Your physician has requested that you have an echocardiogram. Echocardiography is a painless test that uses sound waves to create images of your heart. It provides your doctor with information about the size and shape of your heart and how well your heart's chambers and valves are working. This procedure takes approximately one hour. There are no restrictions for this procedure. Please do NOT wear cologne, perfume, aftershave, or lotions (deodorant is allowed). Please arrive 15 minutes prior to your appointment time.  Please note: We ask at that you not bring children with you during ultrasound (echo/ vascular) testing. Due to room size and safety concerns, children are not allowed in the ultrasound rooms during exams. Our front office staff cannot provide observation of children in our lobby area while testing is being conducted. An adult accompanying a patient to their appointment will only be allowed in the ultrasound room at the discretion of the ultrasound technician under special circumstances. We apologize for any inconvenience.  Your physician recommends that you schedule a follow-up appointment 3 months with echocardiogram(February) call office in December to schedule an appointment  If you have any questions or concerns before your next appointment please send us  a message through [facility] or call our office at [phone].    TO LEAVE A MESSAGE FOR THE NURSE SELECT OPTION 2, PLEASE LEAVE A MESSAGE INCLUDING: YOUR NAME DATE OF BIRTH CALL BACK NUMBER REASON FOR CALL**this is important as we prioritize the call backs  YOU WILL RECEIVE A CALL BACK THE SAME DAY AS LONG AS YOU CALL BEFORE 4:00 PM At the [facility], you and your  health needs are our priority. As part of our continuing mission to provide you with exceptional heart care, we have created designated Provider Care Teams. These Care Teams include your primary Cardiologist (physician) and Advanced Practice Providers (APPs- Physician Assistants and Nurse Practitioners) who all work together to provide you with the care you need, when you need it.   You may see any of the following providers on your designated Care Team at your next follow up: Dr Toribio Fuel Dr Ezra Shuck Dr. [name] Greig Mosses, NP Caffie Shed, [place] [facility] [place], [place] Beckey Coe, NP Jordan Lee, NP Ellouise Class, NP Tinnie Redman, PharmD Jaun Bash, PharmD   Please be sure to bring in all your medications bottles to every appointment.    Thank you for choosing [facility]

## 2024-04-08 NOTE — Progress Notes (Signed)
 ADVANCED HEART FAILURE NOTE  Primary Care: Loreli Elyn SAILOR, MD Primary Cardiologist: Dr. Zenaida  HPI: Joseph Hess is a 58 y.o. male who presents for follow up of chronic systolic heart failure and recent inferior STEMI.     Admitted on 01/02/24 with chest discomfort and found to have an inferior STEMI. Underwent successful PCI to the RCA, mild nonobstructive disease otherwise. Very large, dominant RCA. Resulting EF <30%, patient was started on low dose GDMT and discharged with close follow up.     Follow up 10/25, off all GDMT with NYHA IIb-III symptoms. Volume low/normal so kept off SGLT2i and loops. Coreg , losartan , spiro and dig restarted and meds to [facility] pharmacy.   SUBJECTIVE:  Today he returns for HF follow up. Overall feeling fine. He has SOB if he rushes or walks his dogs. He has occasional positional dizziness, no falls or syncope. Denies palpitations, abnormal bleeding, CP, edema, or PND/Orthopnea. Appetite ok. Weight at home 134 pounds. Has not started Entresto , takes Coreg  once a day. Smokes 5 cigs/day, smokes THC daily, cutting back on ETOH (drinks 1/5th liquor every 5 days). 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 SBP at home 130-140s/70-80s   PMH, current medications, allergies, social history, and family history reviewed in epic.  Wt Readings from Last 3 Encounters:  04/08/24 60.1 kg (132 lb 9.6 oz)  04/02/24 61.5 kg (135 lb 9.3 oz)  03/26/24 60.8 kg (134 lb)   BP 128/82   Pulse (!) 103   Wt 60.1 kg (132 lb 9.6 oz)   SpO2 99%   BMI 18.76 kg/m   PHYSICAL EXAM: General:  NAD. No resp difficulty, walked into clinic, thin HEENT: Normal Neck: Supple. No JVD. Cor: Regular rate & rhythm. No rubs, gallops or murmurs. Lungs: Clear, diminished in bases Abdomen: Soft, nontender, nondistended.  Extremities: No cyanosis, clubbing, rash, edema Neuro: Alert & oriented x 3, moves all 4 extremities w/o difficulty. Affect pleasant.  DATA REVIEW  ZIO: 9/25: Mostly NSR, 25 SVT  runs, 8% PVC burden  ECG: 01/03/2024: NSR with PACs, inferior infarct   02/04/24: NSR with frequent PVCs  03/11/24: NSR with PVCs  ECHO: 01/02/2024: LVEF 25-30%, global hypokinesis, RV mildly reduced, valves fine   CATH: 01/02/2024: RCA 100% occluded, relatively poor flow, 40% RI   ASSESSMENT & PLAN:  Chronic systolic heart failure: Suspected ischemic, but global LV dysfunction out of proportion to RCA infarct suggests some may be do to history of heavy alcohol use. NYHA II, volume stable today. - Medication non-compliance continues to be an issue, but appears to be doing better - Stop losartan . - Start Entresto  24/26 mg bid. - Continue digoxin  0.125 mg daily for RV support - Continue carvedilol  3.125 mg bid; discussed BID dosing. - Continue spironolactone  25 mg daily. - Remain off Jardiance  with orthostasis - Labs today - Repeat echo in 3 months now that he is back on GDMT.  CAD: Recent RCA STEMI - No chest pain - Continue aspirin  + prasugrel  10 mg daily - Continue atorvastatin  80 mg daily - He has been referred to Cardiac rehab   PVCs: asymptomatic - 2 week Zio showed mostly NSR, 8% PVC burden - Continue Coreg , as above  Polysubstance abuse - Discussed smoking and ETOH cessation - He is trying to cut back  HTN - BP well-controlled - Has had orthostasis in the past - Med changes as above  SDOH: he has transportation and insurance. - Medication management improving - Not a candidate for paramedicine. -  Meds to [facility] pharmacy, consider pill  packs or delivery.  Follow up in 3 months with Dr. Zenaida + echo.   Harlene Gainer, FNP-BC [facility] 04/08/24

## 2024-04-10 ENCOUNTER — Encounter (HOSPITAL_COMMUNITY)

## 2024-04-10 ENCOUNTER — Encounter (HOSPITAL_COMMUNITY)
Admission: RE | Admit: 2024-04-10 | Discharge: 2024-04-10 | Disposition: A | Discharge status: Home / Self Care | Source: Ambulatory Visit | Attending: Cardiology | Admitting: Cardiology

## 2024-04-10 DIAGNOSIS — I2111 ST elevation (STEMI) myocardial infarction involving right coronary artery: Secondary | ICD-10-CM | POA: Diagnosis not present

## 2024-04-10 DIAGNOSIS — Z955 Presence of coronary angioplasty implant and graft: Secondary | ICD-10-CM

## 2024-04-10 LAB — GLUCOSE, CAPILLARY
Glucose-Capillary: 105 mg/dL — ABNORMAL HIGH (ref 70–99)
Glucose-Capillary: 138 mg/dL — ABNORMAL HIGH (ref 70–99)

## 2024-04-10 NOTE — Progress Notes (Signed)
 Daily Session Note  Patient Details  Name: Joseph Hess MRN: [id number] Date of Birth: August 13, 1965 Referring Provider:   Flowsheet Row INTENSIVE CARDIAC REHAB ORIENT from 04/02/2024 in [facility]  Referring Provider Dr.Jay Ladona, MD    Encounter Date: 04/10/2024  Check In:  Session Check In - 04/10/24 1647       Check-In   Supervising physician immediately available to respond to emergencies [facility] MD immediately available    Physician(s) Orren Fabry [facility]    Location [facility]    Staff Present Fairy Music, RN, Mallory Parkins, MS, ACSM-CEP, CCRP, Exercise Physiologist;Jaquelynn Wanamaker, RN, BSN;Johnny Fayette, MS, Exercise Physiologist;Bailey Elnor, MS, Exercise Physiologist    Virtual Visit No    Medication changes reported     No    Tobacco Cessation No Change    Current number of cigarettes/nicotine per day     5    Warm-up and Cool-down Performed as group-led instruction    Resistance Training Performed No    VAD Patient? No    PAD/SET Patient? No      Pain Assessment   Currently in Pain? No/denies    Pain Score 0-No pain    Multiple Pain Sites No          Capillary Blood Glucose: Results for orders placed or performed during the hospital encounter of 04/10/24 (from the past 24 hours)  Glucose, capillary     Status: Abnormal   Collection Time: 04/10/24  9:11 AM  Result Value Ref Range   Glucose-Capillary 105 (H) 70 - 99 mg/dL  Glucose, capillary     Status: Abnormal   Collection Time: 04/10/24 10:12 AM  Result Value Ref Range   Glucose-Capillary 138 (H) 70 - 99 mg/dL     Exercise Prescription Changes - 04/10/24 1100       Response to Exercise   Blood Pressure (Exercise) 160/98    Blood Pressure (Exit) 120/80    Heart Rate (Admit) 78 bpm    Heart Rate (Exercise) 100 bpm    Heart Rate (Exit) 76 bpm    Rating of Perceived Exertion (Exercise) 11    Symptoms Pt feeling  lightheaded after exercise    Comments Pt's first day in the CRP2 program    Duration Continue with 30 min of aerobic exercise without signs/symptoms of physical distress.    Intensity THRR unchanged      Progression   Progression Continue to progress workloads to maintain intensity without signs/symptoms of physical distress.      Resistance Training   Training Prescription No    Weight Held due to feeling lightheaded      Interval Training   Interval Training No      Bike   Level 2    Watts 15    Minutes 15    METs 2.7      NuStep   Level 2    SPM 50    Minutes 15    METs 1.3          Social History   Tobacco Use  Smoking Status Every Day   Types: Cigarettes  Smokeless Tobacco Never  Tobacco Comments   Info given     Goals Met:  Reported feeling lightheaded after getting off the nustep. Resolved with rest.  Goals Unmet:  Not Applicable  Comments:Pt started cardiac rehab today.  Pt tolerated light exercise without difficulty. VSS, telemetry-Sinus Rhythm, asymptomatic.  Medication list  reconciled. Pt denies barriers to medicaiton compliance.  PSYCHOSOCIAL ASSESSMENT:  PHQ-1. Pt exhibits positive coping skills, hopeful outlook with supportive family. No psychosocial needs identified at this time, no psychosocial interventions necessary.    Pt enjoys video games spending time with his dog and grand kids.   Pt oriented to exercise equipment and routine.    Understanding verbalized.[name] RN BSN    Dr. Wilbert Bihari is Medical Director for Cardiac Rehab at [facility].

## 2024-04-13 ENCOUNTER — Encounter (HOSPITAL_COMMUNITY): Admission: RE | Admit: 2024-04-13

## 2024-04-13 ENCOUNTER — Encounter (HOSPITAL_COMMUNITY)

## 2024-04-15 ENCOUNTER — Encounter (HOSPITAL_COMMUNITY)

## 2024-04-17 ENCOUNTER — Encounter (HOSPITAL_COMMUNITY)

## 2024-04-20 ENCOUNTER — Encounter (HOSPITAL_COMMUNITY)

## 2024-04-20 ENCOUNTER — Encounter (HOSPITAL_COMMUNITY): Admission: RE | Admit: 2024-04-20 | Source: Ambulatory Visit

## 2024-04-21 ENCOUNTER — Encounter (HOSPITAL_COMMUNITY): Payer: Self-pay | Admitting: *Deleted

## 2024-04-21 DIAGNOSIS — Z955 Presence of coronary angioplasty implant and graft: Secondary | ICD-10-CM

## 2024-04-21 DIAGNOSIS — I2111 ST elevation (STEMI) myocardial infarction involving right coronary artery: Secondary | ICD-10-CM

## 2024-04-21 NOTE — Progress Notes (Signed)
 Cardiac Individual Treatment Plan  Patient Details  Name: Joseph Hess MRN: [id number] Date of Birth: 12-27-1965 Referring Provider:   Flowsheet Row INTENSIVE CARDIAC REHAB ORIENT from 04/02/2024 in [facility]  Referring Provider Dr.Jay Ladona, MD    Initial Encounter Date:  Flowsheet Row INTENSIVE CARDIAC REHAB ORIENT from 04/02/2024 in [facility]  Date 04/02/24    Visit Diagnosis: 01/02/24 STEMI  01/02/24 S/P PCI, DES RCA  Patient's Home Medications on Admission:  Current Outpatient Medications:    aspirin  EC 81 MG tablet, Take 1 tablet (81 mg total) by mouth daily. Swallow whole., Disp: 90 tablet, Rfl: 3   atorvastatin  (LIPITOR) 80 MG tablet, Take 1 tablet (80 mg total) by mouth daily., Disp: 90 tablet, Rfl: 1   carvedilol  (COREG ) 3.125 MG tablet, Take 1 tablet (3.125 mg total) by mouth 2 (two) times daily with a meal., Disp: 180 tablet, Rfl: 2   digoxin  (LANOXIN ) 0.125 MG tablet, Take 1 tablet (0.125 mg total) by mouth daily., Disp: 90 tablet, Rfl: 3   nitroGLYCERIN  (NITROSTAT ) 0.4 MG SL tablet, Place 1 tablet (0.4 mg total) under the tongue every 5 (five) minutes x 3 doses as needed for chest pain., Disp: 25 tablet, Rfl: 2   prasugrel  (EFFIENT ) 10 MG TABS tablet, Take 1 tablet (10 mg total) by mouth daily., Disp: 90 tablet, Rfl: 3   sacubitril -valsartan  (ENTRESTO ) 24-26 MG, Take 1 tablet by mouth 2 (two) times daily., Disp: 60 tablet, Rfl: 11   spironolactone  (ALDACTONE ) 25 MG tablet, Take 1/2 tablet (12.5 mg total) by mouth daily. (Patient taking differently: Take 25 mg by mouth daily.), Disp: 45 tablet, Rfl: 3  Past Medical History: Past Medical History:  Diagnosis Date   Cavernous hemangioma of liver    Chronic pancreatitis (HCC)    Hx of adenomatous colonic polyps 03/30/2015   Pancreatic insufficiency     Tobacco Use: Social History   Tobacco  Use  Smoking Status Every Day   Types: Cigarettes  Smokeless Tobacco Never  Tobacco Comments   Info given     Labs: Review Flowsheet       Latest Ref Rng & Units 09/17/2010 01/02/2024  Labs for ITP Cardiac and Pulmonary Rehab  Cholestrol 0 - 200 mg/dL - 824   LDL (calc) 0 - 99 mg/dL - 85   HDL-C >59 mg/dL - 78   Trlycerides <849 mg/dL - 60   Hemoglobin J8r 4.8 - 5.6 % 5.4 (NOTE)                                                                       According to the ADA Clinical Practice Recommendations for 2011, when HbA1c is used as a screening test:   >=6.5%   Diagnostic of Diabetes Mellitus           (if abnormal result  is confirmed)  5.7-6.4%   Increased risk of developing Diabetes Mellitus  References:Diagnosis and Classification of Diabetes Mellitus,Diabetes Care,2011,34(Suppl 1):S62-S69 and Standards of Medical Care in         Diabetes - 2011,Diabetes Care,2011,34  (Suppl 1):S11-S61.  6.9   TCO2 22 - 32 mmol/L -  20     Capillary Blood Glucose: Lab Results  Component Value Date   GLUCAP 138 (H) 04/10/2024   GLUCAP 105 (H) 04/10/2024   GLUCAP 145 (H) 04/02/2024   GLUCAP 166 (H) 01/05/2024   GLUCAP 128 (H) 01/05/2024     Exercise Target Goals: Exercise Program Goal: Individual exercise prescription set using results from initial 6 min walk test and THRR while considering  patient's activity barriers and safety.   Exercise Prescription Goal: Initial exercise prescription builds to 30-45 minutes a day of aerobic activity, 2-3 days per week.  Home exercise guidelines will be given to patient during program as part of exercise prescription that the participant will acknowledge.  Activity Barriers & Risk Stratification:  Activity Barriers & Cardiac Risk Stratification - 04/02/24 0844       Activity Barriers & Cardiac Risk Stratification   Activity Barriers Deconditioning;Shortness of Breath;Balance Concerns    Cardiac Risk Stratification High          6 Minute  Walk:  6 Minute Walk     Row Name 04/02/24 1046         6 Minute Walk   Phase Initial     Distance 1300 feet     Walk Time 6 minutes     # of Rest Breaks 0     MPH 2.46     METS 4.35     RPE 8     Perceived Dyspnea  1     VO2 Peak 15.22     Symptoms No     Resting HR 86 bpm     Resting BP 130/78     Resting Oxygen Saturation  99 %     Exercise Oxygen Saturation  during 6 min walk 97 %     Max Ex. HR 95 bpm     Max Ex. BP 150/90     2 Minute Post BP 140/80        Oxygen Initial Assessment:   Oxygen Re-Evaluation:   Oxygen Discharge (Final Oxygen Re-Evaluation):   Initial Exercise Prescription:  Initial Exercise Prescription - 04/02/24 1000       Date of Initial Exercise RX and Referring Provider   Date 04/02/24    Referring Provider Dr.Jay Ladona, MD    Expected Discharge Date 06/24/24      Bike   Level 2    Watts 35    Minutes 15    METs 2.2      NuStep   Level 2    SPM 75    Minutes 15    METs 2.2      Prescription Details   Frequency (times per week) 3    Duration Progress to 30 minutes of continuous aerobic without signs/symptoms of physical distress      Intensity   THRR 40-80% of Max Heartrate 66-131    Ratings of Perceived Exertion 11-13    Perceived Dyspnea 0-4      Progression   Progression Continue progressive overload as per policy without signs/symptoms or physical distress.      Resistance Training   Training Prescription Yes    Weight 3    Reps 10-15          Perform Capillary Blood Glucose checks as needed.  Exercise Prescription Changes:   Exercise Prescription Changes     Row Name 04/10/24 1100             Response to Exercise   Blood Pressure (Exercise) 160/98  Blood Pressure (Exit) 120/80       Heart Rate (Admit) 78 bpm       Heart Rate (Exercise) 100 bpm       Heart Rate (Exit) 76 bpm       Rating of Perceived Exertion (Exercise) 11       Symptoms Pt feeling lightheaded after exercise        Comments Pt's first day in the CRP2 program       Duration Continue with 30 min of aerobic exercise without signs/symptoms of physical distress.       Intensity THRR unchanged         Progression   Progression Continue to progress workloads to maintain intensity without signs/symptoms of physical distress.         Resistance Training   Training Prescription No       Weight Held due to feeling lightheaded         Interval Training   Interval Training No         Bike   Level 2       Watts 15       Minutes 15       METs 2.7         NuStep   Level 2       SPM 50       Minutes 15       METs 1.3          Exercise Comments:   Exercise Comments     Row Name 04/10/24 1439           Exercise Comments Pt's first day in the CRP2 program. Pt felt lightheaded after the worklout. Fluids encouraged, Flet better by discharge, asymptomatic.          Exercise Goals and Review:   Exercise Goals     Row Name 04/02/24 0817             Exercise Goals   Increase Physical Activity Yes       Intervention Provide advice, education, support and counseling about physical activity/exercise needs.;Develop an individualized exercise prescription for aerobic and resistive training based on initial evaluation findings, risk stratification, comorbidities and participant's personal goals.       Expected Outcomes Short Term: Attend rehab on a regular basis to increase amount of physical activity.;Long Term: Exercising regularly at least 3-5 days a week.;Long Term: Add in home exercise to make exercise part of routine and to increase amount of physical activity.       Increase Strength and Stamina Yes       Intervention Provide advice, education, support and counseling about physical activity/exercise needs.;Develop an individualized exercise prescription for aerobic and resistive training based on initial evaluation findings, risk stratification, comorbidities and participant's personal goals.        Expected Outcomes Short Term: Increase workloads from initial exercise prescription for resistance, speed, and METs.;Short Term: Perform resistance training exercises routinely during rehab and add in resistance training at home;Long Term: Improve cardiorespiratory fitness, muscular endurance and strength as measured by increased METs and functional capacity ( )       Able to understand and use rate of perceived exertion (RPE) scale Yes       Intervention Provide education and explanation on how to use RPE scale       Expected Outcomes Short Term: Able to use RPE daily in rehab to express subjective intensity level;Long Term:  Able to use RPE to guide intensity  level when exercising independently       Knowledge and understanding of Target Heart Rate Range (THRR) Yes       Intervention Provide education and explanation of THRR including how the numbers were predicted and where they are located for reference       Expected Outcomes Short Term: Able to state/look up THRR;Long Term: Able to use THRR to govern intensity when exercising independently;Short Term: Able to use daily as guideline for intensity in rehab       Understanding of Exercise Prescription Yes       Intervention Provide education, explanation, and written materials on patient's individual exercise prescription       Expected Outcomes Short Term: Able to explain program exercise prescription;Long Term: Able to explain home exercise prescription to exercise independently          Exercise Goals Re-Evaluation :  Exercise Goals Re-Evaluation     Row Name 04/10/24 1126             Exercise Goal Re-Evaluation   Exercise Goals Review Increase Physical Activity;Understanding of Exercise Prescription;Increase Strength and Stamina;Knowledge and understanding of Target Heart Rate Range (THRR);Able to understand and use rate of perceived exertion (RPE) scale       Comments Pt's first day in the CRP2 program. Pt understands the  exercise Rx, THRR, and RPE scale.       Expected Outcomes Will continue to monitor and progress pt workloads as toelrated.          Discharge Exercise Prescription (Final Exercise Prescription Changes):  Exercise Prescription Changes - 04/10/24 1100       Response to Exercise   Blood Pressure (Exercise) 160/98    Blood Pressure (Exit) 120/80    Heart Rate (Admit) 78 bpm    Heart Rate (Exercise) 100 bpm    Heart Rate (Exit) 76 bpm    Rating of Perceived Exertion (Exercise) 11    Symptoms Pt feeling lightheaded after exercise    Comments Pt's first day in the CRP2 program    Duration Continue with 30 min of aerobic exercise without signs/symptoms of physical distress.    Intensity THRR unchanged      Progression   Progression Continue to progress workloads to maintain intensity without signs/symptoms of physical distress.      Resistance Training   Training Prescription No    Weight Held due to feeling lightheaded      Interval Training   Interval Training No      Bike   Level 2    Watts 15    Minutes 15    METs 2.7      NuStep   Level 2    SPM 50    Minutes 15    METs 1.3          Nutrition:  Target Goals: Understanding of nutrition guidelines, daily intake of sodium 1500mg , cholesterol 200mg , calories 30% from fat and 7% or less from saturated fats, daily to have 5 or more servings of fruits and vegetables.  Biometrics:  Pre Biometrics - 04/02/24 0842       Pre Biometrics   Waist Circumference 31 inches    Hip Circumference 35 inches    Waist to Hip Ratio 0.89 %    Triceps Skinfold 6 mm    % Body Fat 16 %    Grip Strength 30 kg    Flexibility 14 in    Single Leg Stand 15 seconds  Nutrition Therapy Plan and Nutrition Goals:  Nutrition Therapy & Goals - 04/10/24 1008       Nutrition Therapy   Diet Heart Healthy    Drug/Food Interactions Statins/Certain Fruits      Personal Nutrition Goals   Nutrition Goal Patient to identify  strategies for reducing cardiovascular risk by attending the Pritikin education and nutrition series weekly.    Personal Goal #2 Patient to improve diet quality by using the plate method as a guide for meal planning to include lean protein/plant protein, fruits, vegetables, whole grains, nonfat dairy as part of a well-balanced diet.    Personal Goal #3 Patient to limit sodium intake to < 1500 mg per day.    Comments Patient with history of CAD, HTN, chronic systolic heart failure and recent inferior STEMI s/p PCI to the RCA on 01/02/2024. Past medical history also significant for chronic pancreatitis, pancreatic insufficiency. Pt reports making attempts to reduce sodium intake. Initial Picture Your Plate score of 40 indicates unhealthy dietary pattern. Current wt at low end of ideal of range based on BMI 18.8 kg/m2. RD reviewed importance of focusing on plant-based diet, ways to limit sodium intake. Patient will benefit from participation in intensive cardiac rehab for nutrition education, exercise, and lifestyle modification.      Intervention Plan   Intervention Prescribe, educate and counsel regarding individualized specific dietary modifications aiming towards targeted core components such as weight, hypertension, lipid management, diabetes, heart failure and other comorbidities.;Nutrition handout(s) given to patient.   Handouts: Low Sodium Nutrition Therapy, Pritikin Eating For A Healthy Heart   Expected Outcomes Short Term Goal: Understand basic principles of dietary content, such as calories, fat, sodium, cholesterol and nutrients.;Long Term Goal: Adherence to prescribed nutrition plan.          Nutrition Assessments:  Nutrition Assessments - 04/02/24 1053       Rate Your Plate Scores   Pre Score 40         MEDIFICTS Score Key: >=70 Need to make dietary changes  40-70 Heart Healthy Diet <= 40 Therapeutic Level Cholesterol Diet   Flowsheet Row INTENSIVE CARDIAC REHAB ORIENT from  04/02/2024 in [facility]  Picture Your Plate Total Score on Admission 40   Picture Your Plate Scores: <59 Unhealthy dietary pattern with much room for improvement. 41-50 Dietary pattern unlikely to meet recommendations for good health and room for improvement. 51-60 More healthful dietary pattern, with some room for improvement.  >60 Healthy dietary pattern, although there may be some specific behaviors that could be improved.    Nutrition Goals Re-Evaluation:   Nutrition Goals Re-Evaluation:   Nutrition Goals Discharge (Final Nutrition Goals Re-Evaluation):   Psychosocial: Target Goals: Acknowledge presence or absence of significant depression and/or stress, maximize coping skills, provide positive support system. Participant is able to verbalize types and ability to use techniques and skills needed for reducing stress and depression.  Initial Review & Psychosocial Screening:  Initial Psych Review & Screening - 04/02/24 0830       Initial Review   Current issues with None Identified      Family Dynamics   Good Support System? Yes   wife and Mom     Barriers   Psychosocial barriers to participate in program There are no identifiable barriers or psychosocial needs.      Screening Interventions   Interventions Encouraged to exercise          Quality of Life Scores:  Quality of Life - 04/02/24 0859       Quality of Life   Select Quality of Life      Quality of Life Scores   Health/Function Pre 27.21 %    Socioeconomic Pre 18.71 %    Psych/Spiritual Pre 30 %    Family Pre 30 %    GLOBAL Pre 26.42 %         Scores of 19 and below usually indicate a poorer quality of life in these areas.  A difference of  2-3 points is a clinically meaningful difference.  A difference of 2-3 points in the total score of the Quality of Life Index has been associated with significant improvement in overall quality of life,  self-image, physical symptoms, and general health in studies assessing change in quality of life.  PHQ-9: Review Flowsheet       04/02/2024 12/30/2014 12/19/2014 12/17/2014  Depression screen PHQ 2/9  Decreased Interest 0 0 0 0  Down, Depressed, Hopeless 0 0 0 0  PHQ - 2 Score 0 0 0 0  Altered sleeping 0 - - -  Tired, decreased energy 1 - - -  Change in appetite 0 - - -  Feeling bad or failure about yourself  0 - - -  Trouble concentrating 0 - - -  Moving slowly or fidgety/restless 0 - - -  Suicidal thoughts 0 - - -  PHQ-9 Score 1 - - -  Difficult doing work/chores Not difficult at all - - -   Interpretation of Total Score  Total Score Depression Severity:  1-4 = Minimal depression, 5-9 = Mild depression, 10-14 = Moderate depression, 15-19 = Moderately severe depression, 20-27 = Severe depression   Psychosocial Evaluation and Intervention:   Psychosocial Re-Evaluation:  Psychosocial Re-Evaluation     Row Name 04/10/24 1659             Psychosocial Re-Evaluation   Current issues with None Identified       Interventions Encouraged to attend Cardiac Rehabilitation for the exercise       Continue Psychosocial Services  No Follow up required          Psychosocial Discharge (Final Psychosocial Re-Evaluation):  Psychosocial Re-Evaluation - 04/10/24 1659       Psychosocial Re-Evaluation   Current issues with None Identified    Interventions Encouraged to attend Cardiac Rehabilitation for the exercise    Continue Psychosocial Services  No Follow up required          Vocational Rehabilitation: Provide vocational rehab assistance to qualifying candidates.   Vocational Rehab Evaluation & Intervention:  Vocational Rehab - 04/02/24 0830       Initial Vocational Rehab Evaluation & Intervention   Assessment shows need for Vocational Rehabilitation No   Zarif is not working currently due his recent cardiac event. Phill hopes to be cleared to return to his current job          Education: Education Goals: Education classes will be provided on a weekly basis, covering required topics. Participant will state understanding/return demonstration of topics presented.     Core Videos: Exercise    Move It!  Clinical staff conducted group or individual video education with verbal and written material and guidebook.  Patient learns the recommended Pritikin exercise program. Exercise with the goal of living a long, healthy life. Some of the health benefits of exercise include controlled diabetes, healthier blood pressure levels, improved cholesterol levels, improved heart and lung capacity, improved sleep, and  better body composition. Everyone should speak with their doctor before starting or changing an exercise routine.  Biomechanical Limitations Clinical staff conducted group or individual video education with verbal and written material and guidebook.  Patient learns how biomechanical limitations can impact exercise and how we can mitigate and possibly overcome limitations to have an impactful and balanced exercise routine.  Body Composition Clinical staff conducted group or individual video education with verbal and written material and guidebook.  Patient learns that body composition (ratio of muscle mass to fat mass) is a key component to assessing overall fitness, rather than body weight alone. Increased fat mass, especially visceral belly fat, can put us  at increased risk for metabolic syndrome, type 2 diabetes, heart disease, and even death. It is recommended to combine diet and exercise (cardiovascular and resistance training) to improve your body composition. Seek guidance from your physician and exercise physiologist before implementing an exercise routine.  Exercise Action Plan Clinical staff conducted group or individual video education with verbal and written material and guidebook.  Patient learns the recommended strategies to achieve and enjoy long-term  exercise adherence, including variety, self-motivation, self-efficacy, and positive decision making. Benefits of exercise include fitness, good health, weight management, more energy, better sleep, less stress, and overall well-being.  Medical   Heart Disease Risk Reduction Clinical staff conducted group or individual video education with verbal and written material and guidebook.  Patient learns our heart is our most vital organ as it circulates oxygen, nutrients, white blood cells, and hormones throughout the entire body, and carries waste away. Data supports a plant-based eating plan like the Pritikin Program for its effectiveness in slowing progression of and reversing heart disease. The video provides a number of recommendations to address heart disease.   Metabolic Syndrome and Belly Fat  Clinical staff conducted group or individual video education with verbal and written material and guidebook.  Patient learns what metabolic syndrome is, how it leads to heart disease, and how one can reverse it and keep it from coming back. You have metabolic syndrome if you have 3 of the following 5 criteria: abdominal obesity, high blood pressure, high triglycerides, low HDL cholesterol, and high blood sugar.  Hypertension and Heart Disease Clinical staff conducted group or individual video education with verbal and written material and guidebook.  Patient learns that high blood pressure, or hypertension, is very common in the [place] . Hypertension is largely due to excessive salt intake, but other important risk factors include being overweight, physical inactivity, drinking too much alcohol, smoking, and not eating enough potassium from fruits and vegetables. High blood pressure is a leading risk factor for heart attack, stroke, congestive heart failure, dementia, kidney failure, and premature death. Long-term effects of excessive salt intake include stiffening of the arteries and thickening of heart  muscle and organ damage. Recommendations include ways to reduce hypertension and the risk of heart disease.  Diseases of Our Time - Focusing on Diabetes Clinical staff conducted group or individual video education with verbal and written material and guidebook.  Patient learns why the best way to stop diseases of our time is prevention, through food and other lifestyle changes. Medicine (such as prescription pills and surgeries) is often only a Band-Aid on the problem, not a long-term solution. Most common diseases of our time include obesity, type 2 diabetes, hypertension, heart disease, and cancer. The Pritikin Program is recommended and has been proven to help reduce, reverse, and/or prevent the damaging effects of metabolic syndrome.  Nutrition  Overview of the Pritikin Eating Plan  Clinical staff conducted group or individual video education with verbal and written material and guidebook.  Patient learns about the Pritikin Eating Plan for disease risk reduction. The Pritikin Eating Plan emphasizes a wide variety of unrefined, minimally-processed carbohydrates, like fruits, vegetables, whole grains, and legumes. Go, Caution, and Stop food choices are explained. Plant-based and lean animal proteins are emphasized. Rationale provided for low sodium intake for blood pressure control, low added sugars for blood sugar stabilization, and low added fats and oils for coronary artery disease risk reduction and weight management.  Calorie Density  Clinical staff conducted group or individual video education with verbal and written material and guidebook.  Patient learns about calorie density and how it impacts the Pritikin Eating Plan. Knowing the characteristics of the food you choose will help you decide whether those foods will lead to weight gain or weight loss, and whether you want to consume more or less of them. Weight loss is usually a side effect of the Pritikin Eating Plan because of its focus on  low calorie-dense foods.  Label Reading  Clinical staff conducted group or individual video education with verbal and written material and guidebook.  Patient learns about the Pritikin recommended label reading guidelines and corresponding recommendations regarding calorie density, added sugars, sodium content, and whole grains.  Dining Out - Part 1  Clinical staff conducted group or individual video education with verbal and written material and guidebook.  Patient learns that restaurant meals can be sabotaging because they can be so high in calories, fat, sodium, and/or sugar. Patient learns recommended strategies on how to positively address this and avoid unhealthy pitfalls.  Facts on Fats  Clinical staff conducted group or individual video education with verbal and written material and guidebook.  Patient learns that lifestyle modifications can be just as effective, if not more so, as many medications for lowering your risk of heart disease. A Pritikin lifestyle can help to reduce your risk of inflammation and atherosclerosis (cholesterol build-up, or plaque, in the artery walls). Lifestyle interventions such as dietary choices and physical activity address the cause of atherosclerosis. A review of the types of fats and their impact on blood cholesterol levels, along with dietary recommendations to reduce fat intake is also included.  Nutrition Action Plan  Clinical staff conducted group or individual video education with verbal and written material and guidebook.  Patient learns how to incorporate Pritikin recommendations into their lifestyle. Recommendations include planning and keeping personal health goals in mind as an important part of their success.  Healthy Mind-Set    Healthy Minds, Bodies, Hearts  Clinical staff conducted group or individual video education with verbal and written material and guidebook.  Patient learns how to identify when they are stressed. Video will discuss  the impact of that stress, as well as the many benefits of stress management. Patient will also be introduced to stress management techniques. The way we think, act, and feel has an impact on our hearts.  How Our Thoughts Can Heal Our Hearts  Clinical staff conducted group or individual video education with verbal and written material and guidebook.  Patient learns that negative thoughts can cause depression and anxiety. This can result in negative lifestyle behavior and serious health problems. Cognitive behavioral therapy is an effective method to help control our thoughts in order to change and improve our emotional outlook.  Additional Videos:  Exercise    Improving Performance  Clinical staff conducted group or individual  video education with verbal and written material and guidebook.  Patient learns to use a non-linear approach by alternating intensity levels and lengths of time spent exercising to help burn more calories and lose more body fat. Cardiovascular exercise helps improve heart health, metabolism, hormonal balance, blood sugar control, and recovery from fatigue. Resistance training improves strength, endurance, balance, coordination, reaction time, metabolism, and muscle mass. Flexibility exercise improves circulation, posture, and balance. Seek guidance from your physician and exercise physiologist before implementing an exercise routine and learn your capabilities and proper form for all exercise.  Introduction to Yoga  Clinical staff conducted group or individual video education with verbal and written material and guidebook.  Patient learns about yoga, a discipline of the coming together of mind, breath, and body. The benefits of yoga include improved flexibility, improved range of motion, better posture and core strength, increased lung function, weight loss, and positive self-image. Yoga's heart health benefits include lowered blood pressure, healthier heart rate, decreased  cholesterol and triglyceride levels, improved immune function, and reduced stress. Seek guidance from your physician and exercise physiologist before implementing an exercise routine and learn your capabilities and proper form for all exercise.  Medical   Aging: Enhancing Your Quality of Life  Clinical staff conducted group or individual video education with verbal and written material and guidebook.  Patient learns key strategies and recommendations to stay in good physical health and enhance quality of life, such as prevention strategies, having an advocate, securing a Health Care Proxy and Power of Attorney, and keeping a list of medications and system for tracking them. It also discusses how to avoid risk for bone loss.  Biology of Weight Control  Clinical staff conducted group or individual video education with verbal and written material and guidebook.  Patient learns that weight gain occurs because we consume more calories than we burn (eating more, moving less). Even if your body weight is normal, you may have higher ratios of fat compared to muscle mass. Too much body fat puts you at increased risk for cardiovascular disease, heart attack, stroke, type 2 diabetes, and obesity-related cancers. In addition to exercise, following the Pritikin Eating Plan can help reduce your risk.  Decoding Lab Results  Clinical staff conducted group or individual video education with verbal and written material and guidebook.  Patient learns that lab test reflects one measurement whose values change over time and are influenced by many factors, including medication, stress, sleep, exercise, food, hydration, pre-existing medical conditions, and more. It is recommended to use the knowledge from this video to become more involved with your lab results and evaluate your numbers to speak with your doctor.   Diseases of Our Time - Overview  Clinical staff conducted group or individual video education with verbal  and written material and guidebook.  Patient learns that according to the CDC, 50% to 70% of chronic diseases (such as obesity, type 2 diabetes, elevated lipids, hypertension, and heart disease) are avoidable through lifestyle improvements including healthier food choices, listening to satiety cues, and increased physical activity.  Sleep Disorders Clinical staff conducted group or individual video education with verbal and written material and guidebook.  Patient learns how good quality and duration of sleep are important to overall health and well-being. Patient also learns about sleep disorders and how they impact health along with recommendations to address them, including discussing with a physician.  Nutrition  Dining Out - Part 2 Clinical staff conducted group or individual video education with verbal and written  material and guidebook.  Patient learns how to plan ahead and communicate in order to maximize their dining experience in a healthy and nutritious manner. Included are recommended food choices based on the type of restaurant the patient is visiting.   Fueling a Banker conducted group or individual video education with verbal and written material and guidebook.  There is a strong connection between our food choices and our health. Diseases like obesity and type 2 diabetes are very prevalent and are in large-part due to lifestyle choices. The Pritikin Eating Plan provides plenty of food and hunger-curbing satisfaction. It is easy to follow, affordable, and helps reduce health risks.  Menu Workshop  Clinical staff conducted group or individual video education with verbal and written material and guidebook.  Patient learns that restaurant meals can sabotage health goals because they are often packed with calories, fat, sodium, and sugar. Recommendations include strategies to plan ahead and to communicate with the manager, chef, or server to help order a healthier  meal.  Planning Your Eating Strategy  Clinical staff conducted group or individual video education with verbal and written material and guidebook.  Patient learns about the Pritikin Eating Plan and its benefit of reducing the risk of disease. The Pritikin Eating Plan does not focus on calories. Instead, it emphasizes high-quality, nutrient-rich foods. By knowing the characteristics of the foods, we choose, we can determine their calorie density and make informed decisions.  Targeting Your Nutrition Priorities  Clinical staff conducted group or individual video education with verbal and written material and guidebook.  Patient learns that lifestyle habits have a tremendous impact on disease risk and progression. This video provides eating and physical activity recommendations based on your personal health goals, such as reducing LDL cholesterol, losing weight, preventing or controlling type 2 diabetes, and reducing high blood pressure.  Vitamins and Minerals  Clinical staff conducted group or individual video education with verbal and written material and guidebook.  Patient learns different ways to obtain key vitamins and minerals, including through a recommended healthy diet. It is important to discuss all supplements you take with your doctor.   Healthy Mind-Set    Smoking Cessation  Clinical staff conducted group or individual video education with verbal and written material and guidebook.  Patient learns that cigarette smoking and tobacco addiction pose a serious health risk which affects millions of people. Stopping smoking will significantly reduce the risk of heart disease, lung disease, and many forms of cancer. Recommended strategies for quitting are covered, including working with your doctor to develop a successful plan.  Culinary   Becoming a Set Designer conducted group or individual video education with verbal and written material and guidebook.  Patient learns  that cooking at home can be healthy, cost-effective, quick, and puts them in control. Keys to cooking healthy recipes will include looking at your recipe, assessing your equipment needs, planning ahead, making it simple, choosing cost-effective seasonal ingredients, and limiting the use of added fats, salts, and sugars.  Cooking - Breakfast and Snacks  Clinical staff conducted group or individual video education with verbal and written material and guidebook.  Patient learns how important breakfast is to satiety and nutrition through the entire day. Recommendations include key foods to eat during breakfast to help stabilize blood sugar levels and to prevent overeating at meals later in the day. Planning ahead is also a key component.  Cooking - Educational Psychologist conducted group or individual  video education with verbal and written material and guidebook.  Patient learns eating strategies to improve overall health, including an approach to cook more at home. Recommendations include thinking of animal protein as a side on your plate rather than center stage and focusing instead on lower calorie dense options like vegetables, fruits, whole grains, and plant-based proteins, such as beans. Making sauces in large quantities to freeze for later and leaving the skin on your vegetables are also recommended to maximize your experience.  Cooking - Healthy Salads and Dressing Clinical staff conducted group or individual video education with verbal and written material and guidebook.  Patient learns that vegetables, fruits, whole grains, and legumes are the foundations of the Pritikin Eating Plan. Recommendations include how to incorporate each of these in flavorful and healthy salads, and how to create homemade salad dressings. Proper handling of ingredients is also covered. Cooking - Soups and [employer] - Soups and Desserts Clinical staff conducted group or individual video education with  verbal and written material and guidebook.  Patient learns that Pritikin soups and desserts make for easy, nutritious, and delicious snacks and meal components that are low in sodium, fat, sugar, and calorie density, while high in vitamins, minerals, and filling fiber. Recommendations include simple and healthy ideas for soups and desserts.   Overview     The Pritikin Solution Program Overview Clinical staff conducted group or individual video education with verbal and written material and guidebook.  Patient learns that the results of the Pritikin Program have been documented in more than 100 articles published in peer-reviewed journals, and the benefits include reducing risk factors for (and, in some cases, even reversing) high cholesterol, high blood pressure, type 2 diabetes, obesity, and more! An overview of the three key pillars of the Pritikin Program will be covered: eating well, doing regular exercise, and having a healthy mind-set.  WORKSHOPS  Exercise: Exercise Basics: Building Your Action Plan Clinical staff led group instruction and group discussion with PowerPoint presentation and patient guidebook. To enhance the learning environment the use of posters, models and videos may be added. At the conclusion of this workshop, patients will comprehend the difference between physical activity and exercise, as well as the benefits of incorporating both, into their routine. Patients will understand the FITT (Frequency, Intensity, Time, and Type) principle and how to use it to build an exercise action plan. In addition, safety concerns and other considerations for exercise and cardiac rehab will be addressed by the presenter. The purpose of this lesson is to promote a comprehensive and effective weekly exercise routine in order to improve patients' overall level of fitness.   Managing Heart Disease: Your Path to a Healthier Heart Clinical staff led group instruction and group discussion with  PowerPoint presentation and patient guidebook. To enhance the learning environment the use of posters, models and videos may be added.At the conclusion of this workshop, patients will understand the anatomy and physiology of the heart. Additionally, they will understand how Pritikin's three pillars impact the risk factors, the progression, and the management of heart disease.  The purpose of this lesson is to provide a high-level overview of the heart, heart disease, and how the Pritikin lifestyle positively impacts risk factors.  Exercise Biomechanics Clinical staff led group instruction and group discussion with PowerPoint presentation and patient guidebook. To enhance the learning environment the use of posters, models and videos may be added. Patients will learn how the structural parts of their bodies function and how these  functions impact their daily activities, movement, and exercise. Patients will learn how to promote a neutral spine, learn how to manage pain, and identify ways to improve their physical movement in order to promote healthy living. The purpose of this lesson is to expose patients to common physical limitations that impact physical activity. Participants will learn practical ways to adapt and manage aches and pains, and to minimize their effect on regular exercise. Patients will learn how to maintain good posture while sitting, walking, and lifting.  Balance Training and Fall Prevention  Clinical staff led group instruction and group discussion with PowerPoint presentation and patient guidebook. To enhance the learning environment the use of posters, models and videos may be added. At the conclusion of this workshop, patients will understand the importance of their sensorimotor skills (vision, proprioception, and the vestibular system) in maintaining their ability to balance as they age. Patients will apply a variety of balancing exercises that are appropriate for their  current level of function. Patients will understand the common causes for poor balance, possible solutions to these problems, and ways to modify their physical environment in order to minimize their fall risk. The purpose of this lesson is to teach patients about the importance of maintaining balance as they age and ways to minimize their risk of falling.  WORKSHOPS   Nutrition:  Fueling a Ship Broker led group instruction and group discussion with PowerPoint presentation and patient guidebook. To enhance the learning environment the use of posters, models and videos may be added. Patients will review the foundational principles of the Pritikin Eating Plan and understand what constitutes a serving size in each of the food groups. Patients will also learn Pritikin-friendly foods that are better choices when away from home and review make-ahead meal and snack options. Calorie density will be reviewed and applied to three nutrition priorities: weight maintenance, weight loss, and weight gain. The purpose of this lesson is to reinforce (in a group setting) the key concepts around what patients are recommended to eat and how to apply these guidelines when away from home by planning and selecting Pritikin-friendly options. Patients will understand how calorie density may be adjusted for different weight management goals.  Mindful Eating  Clinical staff led group instruction and group discussion with PowerPoint presentation and patient guidebook. To enhance the learning environment the use of posters, models and videos may be added. Patients will briefly review the concepts of the Pritikin Eating Plan and the importance of low-calorie dense foods. The concept of mindful eating will be introduced as well as the importance of paying attention to internal hunger signals. Triggers for non-hunger eating and techniques for dealing with triggers will be explored. The purpose of this lesson is to provide  patients with the opportunity to review the basic principles of the Pritikin Eating Plan, discuss the value of eating mindfully and how to measure internal cues of hunger and fullness using the Hunger Scale. Patients will also discuss reasons for non-hunger eating and learn strategies to use for controlling emotional eating.  Targeting Your Nutrition Priorities Clinical staff led group instruction and group discussion with PowerPoint presentation and patient guidebook. To enhance the learning environment the use of posters, models and videos may be added. Patients will learn how to determine their genetic susceptibility to disease by reviewing their family history. Patients will gain insight into the importance of diet as part of an overall healthy lifestyle in mitigating the impact of genetics and other environmental insults. The purpose  of this lesson is to provide patients with the opportunity to assess their personal nutrition priorities by looking at their family history, their own health history and current risk factors. Patients will also be able to discuss ways of prioritizing and modifying the Pritikin Eating Plan for their highest risk areas  Menu  Clinical staff led group instruction and group discussion with PowerPoint presentation and patient guidebook. To enhance the learning environment the use of posters, models and videos may be added. Using menus brought in from [employer], or printed from [employer], patients will apply the Pritikin dining out guidelines that were presented in the [employer] video. Patients will also be able to practice these guidelines in a variety of provided scenarios. The purpose of this lesson is to provide patients with the opportunity to practice hands-on learning of the Pritikin Dining Out guidelines with actual menus and practice scenarios.  Label Reading Clinical staff led group instruction and group discussion with PowerPoint  presentation and patient guidebook. To enhance the learning environment the use of posters, models and videos may be added. Patients will review and discuss the Pritikin label reading guidelines presented in Pritikin's Label Reading Educational series video. Using fool labels brought in from local grocery stores and markets, patients will apply the label reading guidelines and determine if the packaged food meet the Pritikin guidelines. The purpose of this lesson is to provide patients with the opportunity to review, discuss, and practice hands-on learning of the Pritikin Label Reading guidelines with actual packaged food labels. Cooking School  Pritikin's [employer] are designed to teach patients ways to prepare quick, simple, and affordable recipes at home. The importance of nutrition's role in chronic disease risk reduction is reflected in its emphasis in the overall Pritikin program. By learning how to prepare essential core Pritikin Eating Plan recipes, patients will increase control over what they eat; be able to customize the flavor of foods without the use of added salt, sugar, or fat; and improve the quality of the food they consume. By learning a set of core recipes which are easily assembled, quickly prepared, and affordable, patients are more likely to prepare more healthy foods at home. These workshops focus on convenient breakfasts, simple entres, side dishes, and desserts which can be prepared with minimal effort and are consistent with nutrition recommendations for cardiovascular risk reduction. Cooking [employer] are taught by a armed forces logistics/support/administrative officer (RD) who has been trained by the [employer]. The chef or RD has a clear understanding of the importance of minimizing - if not completely eliminating - added fat, sugar, and sodium in recipes. Throughout the series of Cooking School Workshop sessions, patients will learn about healthy ingredients and  efficient methods of cooking to build confidence in their capability to prepare    Cooking School weekly topics:  Adding Flavor- Sodium-Free  Fast and Healthy Breakfasts  Powerhouse Plant-Based Proteins  Satisfying Salads and Dressings  Simple Sides and Sauces  International Cuisine-Spotlight on the [employer] Zones  Delicious Desserts  Savory Soups  [employer] - Meals in a Astronomer Appetizers and Snacks  Comforting Weekend Breakfasts  One-Pot Wonders   Fast Evening Meals  Landscape Architect Your Pritikin Plate  WORKSHOPS   Healthy Mindset (Psychosocial):  Focused Goals, Sustainable Changes Clinical staff led group instruction and group discussion with PowerPoint presentation and patient guidebook. To enhance the learning environment the use of posters, models and videos may be added.  Patients will be able to apply effective goal setting strategies to establish at least one personal goal, and then take consistent, meaningful action toward that goal. They will learn to identify common barriers to achieving personal goals and develop strategies to overcome them. Patients will also gain an understanding of how our mind-set can impact our ability to achieve goals and the importance of cultivating a positive and growth-oriented mind-set. The purpose of this lesson is to provide patients with a deeper understanding of how to set and achieve personal goals, as well as the tools and strategies needed to overcome common obstacles which may arise along the way.  From Head to Heart: The Power of a Healthy Outlook  Clinical staff led group instruction and group discussion with PowerPoint presentation and patient guidebook. To enhance the learning environment the use of posters, models and videos may be added. Patients will be able to recognize and describe the impact of emotions and mood on physical health. They will discover the importance of self-care and explore self-care  practices which may work for them. Patients will also learn how to utilize the 4 C's to cultivate a healthier outlook and better manage stress and challenges. The purpose of this lesson is to demonstrate to patients how a healthy outlook is an essential part of maintaining good health, especially as they continue their cardiac rehab journey.  Healthy Sleep for a Healthy Heart Clinical staff led group instruction and group discussion with PowerPoint presentation and patient guidebook. To enhance the learning environment the use of posters, models and videos may be added. At the conclusion of this workshop, patients will be able to demonstrate knowledge of the importance of sleep to overall health, well-being, and quality of life. They will understand the symptoms of, and treatments for, common sleep disorders. Patients will also be able to identify daytime and nighttime behaviors which impact sleep, and they will be able to apply these tools to help manage sleep-related challenges. The purpose of this lesson is to provide patients with a general overview of sleep and outline the importance of quality sleep. Patients will learn about a few of the most common sleep disorders. Patients will also be introduced to the concept of "sleep hygiene," and discover ways to self-manage certain sleeping problems through simple daily behavior changes. Finally, the workshop will motivate patients by clarifying the links between quality sleep and their goals of heart-healthy living.   Recognizing and Reducing Stress Clinical staff led group instruction and group discussion with PowerPoint presentation and patient guidebook. To enhance the learning environment the use of posters, models and videos may be added. At the conclusion of this workshop, patients will be able to understand the types of stress reactions, differentiate between acute and chronic stress, and recognize the impact that chronic stress has on their health. They  will also be able to apply different coping mechanisms, such as reframing negative self-talk. Patients will have the opportunity to practice a variety of stress management techniques, such as deep abdominal breathing, progressive muscle relaxation, and/or guided imagery.  The purpose of this lesson is to educate patients on the role of stress in their lives and to provide healthy techniques for coping with it.  Learning Barriers/Preferences:  Learning Barriers/Preferences - 04/02/24 0845       Learning Barriers/Preferences   Learning Barriers Exercise Concerns   Sometimes dizzy   Learning Preferences Computer/Internet;Group Instruction;Individual Instruction;Pictoral;Skilled Demonstration;Video;Written Material          Education Topics:  Knowledge Questionnaire  Score:  Knowledge Questionnaire Score - 04/02/24 0854       Knowledge Questionnaire Score   Pre Score 22/28          Core Components/Risk Factors/Patient Goals at Admission:  Personal Goals and Risk Factors at Admission - 04/02/24 0848       Core Components/Risk Factors/Patient Goals on Admission    Weight Management Yes;Weight Gain    Intervention Weight Management: Develop a combined nutrition and exercise program designed to reach desired caloric intake, while maintaining appropriate intake of nutrient and fiber, sodium and fats, and appropriate energy expenditure required for the weight goal.;Weight Management: Provide education and appropriate resources to help participant work on and attain dietary goals.    Admit Weight 134 lb 14.7 oz (61.2 kg)    Expected Outcomes Short Term: Continue to assess and modify interventions until short term weight is achieved;Long Term: Adherence to nutrition and physical activity/exercise program aimed toward attainment of established weight goal;Weight Loss: Understanding of general recommendations for a balanced deficit meal plan, which promotes 1-2 lb weight loss per week and  includes a negative energy balance of [phone] kcal/d;Understanding recommendations for meals to include 15-35% energy as protein, 25-35% energy from fat, 35-60% energy from carbohydrates, less than 200mg  of dietary cholesterol, 20-35 gm of total fiber daily;Understanding of distribution of calorie intake throughout the day with the consumption of 4-5 meals/snacks    Improve shortness of breath with ADL's Yes    Intervention Provide education, individualized exercise plan and daily activity instruction to help decrease symptoms of SOB with activities of daily living.    Expected Outcomes Short Term: Improve cardiorespiratory fitness to achieve a reduction of symptoms when performing ADLs;Long Term: Be able to perform more ADLs without symptoms or delay the onset of symptoms    Hypertension Yes    Intervention Provide education on lifestyle modifcations including regular physical activity/exercise, weight management, moderate sodium restriction and increased consumption of fresh fruit, vegetables, and low fat dairy, alcohol moderation, and smoking cessation.;Monitor prescription use compliance.    Expected Outcomes Short Term: Continued assessment and intervention until BP is < 140/59mm HG in hypertensive participants. < 130/54mm HG in hypertensive participants with diabetes, heart failure or chronic kidney disease.;Long Term: Maintenance of blood pressure at goal levels.    Stress Yes    Intervention Offer individual and/or small group education and counseling on adjustment to heart disease, stress management and health-related lifestyle change. Teach and support self-help strategies.;Refer participants experiencing significant psychosocial distress to appropriate mental health specialists for further evaluation and treatment. When possible, include family members and significant others in education/counseling sessions.    Expected Outcomes Short Term: Participant demonstrates changes in health-related  behavior, relaxation and other stress management skills, ability to obtain effective social support, and compliance with psychotropic medications if prescribed.;Long Term: Emotional wellbeing is indicated by absence of clinically significant psychosocial distress or social isolation.    Personal Goal Other Yes    Personal Goal ST: eat better, more veggies LT weight gain, control SOB, increase strength    Intervention Will continue to monitor pt and increase WL's as tolerated without sign or symptom    Expected Outcomes Pt will achieve his goals          Core Components/Risk Factors/Patient Goals Review:   Goals and Risk Factor Review     Row Name 04/10/24 1700 04/21/24 0740           Core Components/Risk Factors/Patient Goals Review   Personal Goals Review  Weight Management/Obesity;Tobacco Cessation;Hypertension;Lipids;Stress Weight Management/Obesity;Tobacco Cessation;Hypertension;Lipids;Stress      Review Lemuel started cardiac rehab on 11/21.25. Idus did fair with exercise for his fitness level. Some resting and exertional BP elevations noted. Spot checked CBG's. Sahir admits he needs to make some dietary changes. encouraged smoking cessation. Imraan started cardiac rehab on 11/21.25. Kumar has only attended one exercise session. Cannen attendance has been poor.      Expected Outcomes Jadier will continue to participate in cardiac rehab for exercise, nutrition and lifestyle modifications. Zarion will continue to participate in cardiac rehab for exercise, nutrition and lifestyle modifications when in attendance.         Core Components/Risk Factors/Patient Goals at Discharge (Final Review):   Goals and Risk Factor Review - 04/21/24 0740       Core Components/Risk Factors/Patient Goals Review   Personal Goals Review Weight Management/Obesity;Tobacco Cessation;Hypertension;Lipids;Stress    Review Steadman started cardiac rehab on 11/21.25. Tri has only attended one exercise session. Arden  attendance has been poor.    Expected Outcomes Jaja will continue to participate in cardiac rehab for exercise, nutrition and lifestyle modifications when in attendance.          ITP Comments:  ITP Comments     Row Name 03/24/24 9076 04/02/24 0815 04/10/24 1657 04/21/24 0737     ITP Comments Dr Wilbert Bihari MD, Medical Director Dr. Wilbert Bihari medical director. Introduction to pritikin education/intensive cardiac rehab. Initial orientation packet reviewed with patient. 30 Day ITP Katrell started cardiac rehab on 04/10/24. Micael did well with exercise for his fitness level. Kristin did experience some lightheadedness toward the end of exercise. Resolved with rest. 30 Day ITP Simone has only attended one exercise session at cardiac rehab. Eulises attendance is poor.       Comments: See ITP comments.[name] RN BSN

## 2024-04-22 ENCOUNTER — Encounter (HOSPITAL_COMMUNITY)
Admission: RE | Admit: 2024-04-22 | Discharge: 2024-04-22 | Disposition: A | Source: Ambulatory Visit | Attending: Cardiology

## 2024-04-22 ENCOUNTER — Encounter (HOSPITAL_COMMUNITY)

## 2024-04-22 DIAGNOSIS — I2111 ST elevation (STEMI) myocardial infarction involving right coronary artery: Secondary | ICD-10-CM | POA: Insufficient documentation

## 2024-04-22 DIAGNOSIS — Z955 Presence of coronary angioplasty implant and graft: Secondary | ICD-10-CM | POA: Diagnosis present

## 2024-04-22 LAB — GLUCOSE, CAPILLARY: Glucose-Capillary: 101 mg/dL — ABNORMAL HIGH (ref 70–99)

## 2024-04-23 ENCOUNTER — Other Ambulatory Visit: Payer: Self-pay

## 2024-04-24 ENCOUNTER — Encounter (HOSPITAL_COMMUNITY)

## 2024-04-27 ENCOUNTER — Telehealth (HOSPITAL_COMMUNITY): Payer: Self-pay | Admitting: Family Medicine

## 2024-04-27 ENCOUNTER — Encounter (HOSPITAL_COMMUNITY)

## 2024-04-27 NOTE — Telephone Encounter (Signed)
 OK for patient to return to work. Note for employer available under Letters

## 2024-04-27 NOTE — Addendum Note (Signed)
 Encounter addended by: Glena Harlene HERO, FNP on: 04/27/2024 1:35 PM  Actions taken: Letter saved

## 2024-04-29 ENCOUNTER — Encounter (HOSPITAL_COMMUNITY)

## 2024-04-29 ENCOUNTER — Telehealth (HOSPITAL_COMMUNITY): Payer: Self-pay

## 2024-04-29 NOTE — Telephone Encounter (Signed)
 Attempted to call patient regarding 3x no call, no shows in a row- no answer, left message to call us  back.

## 2024-05-01 ENCOUNTER — Encounter (HOSPITAL_COMMUNITY)

## 2024-05-01 ENCOUNTER — Encounter (HOSPITAL_COMMUNITY)
Admission: RE | Admit: 2024-05-01 | Discharge: 2024-05-01 | Disposition: A | Source: Ambulatory Visit | Attending: Cardiology

## 2024-05-01 DIAGNOSIS — I2111 ST elevation (STEMI) myocardial infarction involving right coronary artery: Secondary | ICD-10-CM | POA: Diagnosis not present

## 2024-05-04 ENCOUNTER — Encounter (HOSPITAL_COMMUNITY)

## 2024-05-04 ENCOUNTER — Encounter (HOSPITAL_COMMUNITY): Admission: RE | Admit: 2024-05-04 | Discharge: 2024-05-04 | Attending: Cardiology

## 2024-05-06 ENCOUNTER — Encounter (HOSPITAL_COMMUNITY): Admission: RE | Admit: 2024-05-06 | Source: Ambulatory Visit

## 2024-05-06 ENCOUNTER — Encounter (HOSPITAL_COMMUNITY)

## 2024-05-08 ENCOUNTER — Encounter (HOSPITAL_COMMUNITY)

## 2024-05-08 DIAGNOSIS — Z955 Presence of coronary angioplasty implant and graft: Secondary | ICD-10-CM

## 2024-05-08 DIAGNOSIS — I2111 ST elevation (STEMI) myocardial infarction involving right coronary artery: Secondary | ICD-10-CM

## 2024-05-11 ENCOUNTER — Encounter (HOSPITAL_COMMUNITY)

## 2024-05-13 ENCOUNTER — Encounter (HOSPITAL_COMMUNITY)

## 2024-05-15 ENCOUNTER — Encounter (HOSPITAL_COMMUNITY)

## 2024-05-16 NOTE — Progress Notes (Signed)
 Cardiac Individual Treatment Plan  Patient Details  Name: Joseph Hess MRN: [id number] Date of Birth: 17-Mar-1966 Referring Provider:   Flowsheet Row INTENSIVE CARDIAC REHAB ORIENT from 04/02/2024 in [facility]  Referring Provider Dr.Jay Ladona, MD    Initial Encounter Date:  Flowsheet Row INTENSIVE CARDIAC REHAB ORIENT from 04/02/2024 in [facility]  Date 04/02/24    Visit Diagnosis: 01/02/24 STEMI  01/02/24 S/P PCI, DES RCA  Patient's Home Medications on Admission: Current Medications[1]  Past Medical History: Past Medical History:  Diagnosis Date   Cavernous hemangioma of liver    Chronic pancreatitis (HCC)    Hx of adenomatous colonic polyps 03/30/2015   Pancreatic insufficiency     Tobacco Use: Tobacco Use History[2]  Labs: Review Flowsheet       Latest Ref Rng & Units 09/17/2010 01/02/2024  Labs for ITP Cardiac and Pulmonary Rehab  Cholestrol 0 - 200 mg/dL - 824   LDL (calc) 0 - 99 mg/dL - 85   HDL-C >59 mg/dL - 78   Trlycerides <849 mg/dL - 60   Hemoglobin J8r 4.8 - 5.6 % 5.4 (NOTE)                                                                       According to the ADA Clinical Practice Recommendations for 2011, when HbA1c is used as a screening test:   >=6.5%   Diagnostic of Diabetes Mellitus           (if abnormal result  is confirmed)  5.7-6.4%   Increased risk of developing Diabetes Mellitus  References:Diagnosis and Classification of Diabetes Mellitus,Diabetes Care,2011,34(Suppl 1):S62-S69 and Standards of Medical Care in         Diabetes - 2011,Diabetes Care,2011,34  (Suppl 1):S11-S61.  6.9   TCO2 22 - 32 mmol/L - 20     Capillary Blood Glucose: Lab Results  Component Value Date   GLUCAP 101 (H) 04/22/2024   GLUCAP 138 (H) 04/10/2024   GLUCAP 105 (H) 04/10/2024   GLUCAP 145 (H) 04/02/2024   GLUCAP 166 (H) 01/05/2024      Exercise Target Goals: Exercise Program Goal: Individual exercise prescription set using results from initial 6 min walk test and THRR while considering  patients activity barriers and safety.   Exercise Prescription Goal: Initial exercise prescription builds to 30-45 minutes a day of aerobic activity, 2-3 days per week.  Home exercise guidelines will be given to patient during program as part of exercise prescription that the participant will acknowledge.  Activity Barriers & Risk Stratification:  Activity Barriers & Cardiac Risk Stratification - 04/02/24 0844       Activity Barriers & Cardiac Risk Stratification   Activity Barriers Deconditioning;Shortness of Breath;Balance Concerns    Cardiac Risk Stratification High          6 Minute Walk:  6 Minute Walk     Row Name 04/02/24 1046         6 Minute Walk   Phase Initial     Distance 1300 feet     Walk Time 6 minutes     # of Rest Breaks 0  MPH 2.46     METS 4.35     RPE 8     Perceived Dyspnea  1     VO2 Peak 15.22     Symptoms No     Resting HR 86 bpm     Resting BP 130/78     Resting Oxygen Saturation  99 %     Exercise Oxygen Saturation  during 6 min walk 97 %     Max Ex. HR 95 bpm     Max Ex. BP 150/90     2 Minute Post BP 140/80        Oxygen Initial Assessment:   Oxygen Re-Evaluation:   Oxygen Discharge (Final Oxygen Re-Evaluation):   Initial Exercise Prescription:  Initial Exercise Prescription - 04/02/24 1000       Date of Initial Exercise RX and Referring Provider   Date 04/02/24    Referring Provider Dr.Jay Ladona, MD    Expected Discharge Date 06/24/24      Bike   Level 2    Watts 35    Minutes 15    METs 2.2      NuStep   Level 2    SPM 75    Minutes 15    METs 2.2      Prescription Details   Frequency (times per week) 3    Duration Progress to 30 minutes of continuous aerobic without signs/symptoms of physical distress      Intensity   THRR 40-80% of Max  Heartrate 66-131    Ratings of Perceived Exertion 11-13    Perceived Dyspnea 0-4      Progression   Progression Continue progressive overload as per policy without signs/symptoms or physical distress.      Resistance Training   Training Prescription Yes    Weight 3    Reps 10-15          Perform Capillary Blood Glucose checks as needed.  Exercise Prescription Changes:   Exercise Prescription Changes     Row Name 04/10/24 1100 05/08/24 1600           Response to Exercise   Blood Pressure (Admit) -- 140/80      Blood Pressure (Exercise) 160/98 160/70      Blood Pressure (Exit) 120/80 132/80      Heart Rate (Admit) 78 bpm 92 bpm      Heart Rate (Exercise) 100 bpm 116 bpm      Heart Rate (Exit) 76 bpm 94 bpm      Rating of Perceived Exertion (Exercise) 11 10      Symptoms Pt feeling lightheaded after exercise None      Comments Pt's first day in the CRP2 program Reviewed METs/goals      Duration Continue with 30 min of aerobic exercise without signs/symptoms of physical distress. Continue with 30 min of aerobic exercise without signs/symptoms of physical distress.      Intensity THRR unchanged THRR unchanged        Progression   Progression Continue to progress workloads to maintain intensity without signs/symptoms of physical distress. Continue to progress workloads to maintain intensity without signs/symptoms of physical distress.      Average METs -- 2.9        Resistance Training   Training Prescription No --      Weight Held due to feeling lightheaded 3 lbs      Reps -- 10-15      Time -- 5 Minutes  Interval Training   Interval Training No No        Bike   Level 2 2      Watts 15 27      Minutes 15 15      METs 2.7 3.3        NuStep   Level 2 2      SPM 50 81      Minutes 15 15      METs 1.3 2.4         Exercise Comments:   Exercise Comments     Row Name 04/10/24 1439 04/24/24 0732 05/08/24 1608       Exercise Comments Pt's first day in  the CRP2 program. Pt felt lightheaded after the worklout. Fluids encouraged, Flet better by discharge, asymptomatic. METs review due, pt has only attended a couple of session. Poor compliance with program. Will review when patient returns. Pt has progressed on his METS previous to todays session. MEts dripped off today. Encouraged consistancy with workloads.        Exercise Goals and Review:   Exercise Goals     Row Name 04/02/24 0817             Exercise Goals   Increase Physical Activity Yes       Intervention Provide advice, education, support and counseling about physical activity/exercise needs.;Develop an individualized exercise prescription for aerobic and resistive training based on initial evaluation findings, risk stratification, comorbidities and participant's personal goals.       Expected Outcomes Short Term: Attend rehab on a regular basis to increase amount of physical activity.;Long Term: Exercising regularly at least 3-5 days a week.;Long Term: Add in home exercise to make exercise part of routine and to increase amount of physical activity.       Increase Strength and Stamina Yes       Intervention Provide advice, education, support and counseling about physical activity/exercise needs.;Develop an individualized exercise prescription for aerobic and resistive training based on initial evaluation findings, risk stratification, comorbidities and participant's personal goals.       Expected Outcomes Short Term: Increase workloads from initial exercise prescription for resistance, speed, and METs.;Short Term: Perform resistance training exercises routinely during rehab and add in resistance training at home;Long Term: Improve cardiorespiratory fitness, muscular endurance and strength as measured by increased METs and functional capacity ( )       Able to understand and use rate of perceived exertion (RPE) scale Yes       Intervention Provide education and explanation on how to  use RPE scale       Expected Outcomes Short Term: Able to use RPE daily in rehab to express subjective intensity level;Long Term:  Able to use RPE to guide intensity level when exercising independently       Knowledge and understanding of Target Heart Rate Range (THRR) Yes       Intervention Provide education and explanation of THRR including how the numbers were predicted and where they are located for reference       Expected Outcomes Short Term: Able to state/look up THRR;Long Term: Able to use THRR to govern intensity when exercising independently;Short Term: Able to use daily as guideline for intensity in rehab       Understanding of Exercise Prescription Yes       Intervention Provide education, explanation, and written materials on patient's individual exercise prescription       Expected Outcomes Short Term: Able to explain program  exercise prescription;Long Term: Able to explain home exercise prescription to exercise independently          Exercise Goals Re-Evaluation :  Exercise Goals Re-Evaluation     Row Name 04/10/24 1126 05/08/24 1606           Exercise Goal Re-Evaluation   Exercise Goals Review Increase Physical Activity;Understanding of Exercise Prescription;Increase Strength and Stamina;Knowledge and understanding of Target Heart Rate Range (THRR);Able to understand and use rate of perceived exertion (RPE) scale Increase Physical Activity;Understanding of Exercise Prescription;Increase Strength and Stamina;Knowledge and understanding of Target Heart Rate Range (THRR);Able to understand and use rate of perceived exertion (RPE) scale      Comments Pt's first day in the CRP2 program. Pt understands the exercise Rx, THRR, and RPE scale. Reviewed METs and goals. Pt has only attended 4 sessions to date. Pt voices he is making some progress on his goal to eat more vegtables. Attendence is sporadic.      Expected Outcomes Will continue to monitor and progress pt workloads as toelrated.  Will continue to monitor and progress pt workloads as toelrated.         Discharge Exercise Prescription (Final Exercise Prescription Changes):  Exercise Prescription Changes - 05/08/24 1600       Response to Exercise   Blood Pressure (Admit) 140/80    Blood Pressure (Exercise) 160/70    Blood Pressure (Exit) 132/80    Heart Rate (Admit) 92 bpm    Heart Rate (Exercise) 116 bpm    Heart Rate (Exit) 94 bpm    Rating of Perceived Exertion (Exercise) 10    Symptoms None    Comments Reviewed METs/goals    Duration Continue with 30 min of aerobic exercise without signs/symptoms of physical distress.    Intensity THRR unchanged      Progression   Progression Continue to progress workloads to maintain intensity without signs/symptoms of physical distress.    Average METs 2.9      Resistance Training   Weight 3 lbs    Reps 10-15    Time 5 Minutes      Interval Training   Interval Training No      Bike   Level 2    Watts 27    Minutes 15    METs 3.3      NuStep   Level 2    SPM 81    Minutes 15    METs 2.4          Nutrition:  Target Goals: Understanding of nutrition guidelines, daily intake of sodium 1500mg , cholesterol 200mg , calories 30% from fat and 7% or less from saturated fats, daily to have 5 or more servings of fruits and vegetables.  Biometrics:  Pre Biometrics - 04/02/24 0842       Pre Biometrics   Waist Circumference 31 inches    Hip Circumference 35 inches    Waist to Hip Ratio 0.89 %    Triceps Skinfold 6 mm    % Body Fat 16 %    Grip Strength 30 kg    Flexibility 14 in    Single Leg Stand 15 seconds           Nutrition Therapy Plan and Nutrition Goals:  Nutrition Therapy & Goals - 04/10/24 1008       Nutrition Therapy   Diet Heart Healthy    Drug/Food Interactions Statins/Certain Fruits      Personal Nutrition Goals   Nutrition Goal Patient to identify strategies for  reducing cardiovascular risk by attending the Pritikin education  and nutrition series weekly.    Personal Goal #2 Patient to improve diet quality by using the plate method as a guide for meal planning to include lean protein/plant protein, fruits, vegetables, whole grains, nonfat dairy as part of a well-balanced diet.    Personal Goal #3 Patient to limit sodium intake to < 1500 mg per day.    Comments Patient with history of CAD, HTN, chronic systolic heart failure and recent inferior STEMI s/p PCI to the RCA on 01/02/2024. Past medical history also significant for chronic pancreatitis, pancreatic insufficiency. Pt reports making attempts to reduce sodium intake. Initial Picture Your Plate score of 40 indicates unhealthy dietary pattern. Current wt at low end of ideal of range based on BMI 18.8 kg/m2. RD reviewed importance of focusing on plant-based diet, ways to limit sodium intake. Patient will benefit from participation in intensive cardiac rehab for nutrition education, exercise, and lifestyle modification.      Intervention Plan   Intervention Prescribe, educate and counsel regarding individualized specific dietary modifications aiming towards targeted core components such as weight, hypertension, lipid management, diabetes, heart failure and other comorbidities.;Nutrition handout(s) given to patient.   Handouts: Low Sodium Nutrition Therapy, Pritikin Eating For A Healthy Heart   Expected Outcomes Short Term Goal: Understand basic principles of dietary content, such as calories, fat, sodium, cholesterol and nutrients.;Long Term Goal: Adherence to prescribed nutrition plan.          Nutrition Assessments:  Nutrition Assessments - 04/02/24 1053       Rate Your Plate Scores   Pre Score 40         MEDIFICTS Score Key: >=70 Need to make dietary changes  40-70 Heart Healthy Diet <= 40 Therapeutic Level Cholesterol Diet   Flowsheet Row INTENSIVE CARDIAC REHAB ORIENT from 04/02/2024 in [facility]   Picture Your Plate Total Score on Admission 40   Picture Your Plate Scores: <59 Unhealthy dietary pattern with much room for improvement. 41-50 Dietary pattern unlikely to meet recommendations for good health and room for improvement. 51-60 More healthful dietary pattern, with some room for improvement.  >60 Healthy dietary pattern, although there may be some specific behaviors that could be improved.    Nutrition Goals Re-Evaluation:   Nutrition Goals Re-Evaluation:   Nutrition Goals Discharge (Final Nutrition Goals Re-Evaluation):   Psychosocial: Target Goals: Acknowledge presence or absence of significant depression and/or stress, maximize coping skills, provide positive support system. Participant is able to verbalize types and ability to use techniques and skills needed for reducing stress and depression.  Initial Review & Psychosocial Screening:  Initial Psych Review & Screening - 04/02/24 0830       Initial Review   Current issues with None Identified      Family Dynamics   Good Support System? Yes   wife and Mom     Barriers   Psychosocial barriers to participate in program There are no identifiable barriers or psychosocial needs.      Screening Interventions   Interventions Encouraged to exercise          Quality of Life Scores:  Quality of Life - 04/02/24 0859       Quality of Life   Select Quality of Life      Quality of Life Scores   Health/Function Pre 27.21 %    Socioeconomic Pre 18.71 %    Psych/Spiritual Pre 30 %  Family Pre 30 %    GLOBAL Pre 26.42 %         Scores of 19 and below usually indicate a poorer quality of life in these areas.  A difference of  2-3 points is a clinically meaningful difference.  A difference of 2-3 points in the total score of the Quality of Life Index has been associated with significant improvement in overall quality of life, self-image, physical symptoms, and general health in studies assessing change in  quality of life.  PHQ-9: Review Flowsheet       04/02/2024 12/30/2014 12/19/2014 12/17/2014  Depression screen PHQ 2/9  Decreased Interest 0 0 0 0  Down, Depressed, Hopeless 0 0 0 0  PHQ - 2 Score 0 0 0 0  Altered sleeping 0 - - -  Tired, decreased energy 1 - - -  Change in appetite 0 - - -  Feeling bad or failure about yourself  0 - - -  Trouble concentrating 0 - - -  Moving slowly or fidgety/restless 0 - - -  Suicidal thoughts 0 - - -  PHQ-9 Score 1 - - -  Difficult doing work/chores Not difficult at all - - -   Interpretation of Total Score  Total Score Depression Severity:  1-4 = Minimal depression, 5-9 = Mild depression, 10-14 = Moderate depression, 15-19 = Moderately severe depression, 20-27 = Severe depression   Psychosocial Evaluation and Intervention:   Psychosocial Re-Evaluation:  Psychosocial Re-Evaluation     Row Name 04/10/24 1659 05/16/24 1120           Psychosocial Re-Evaluation   Current issues with None Identified None Identified      Comments -- Kona has not voiced any concerns or stressor the few times he has been at cardiac rehab.      Interventions Encouraged to attend Cardiac Rehabilitation for the exercise Encouraged to attend Cardiac Rehabilitation for the exercise      Continue Psychosocial Services  No Follow up required No Follow up required         Psychosocial Discharge (Final Psychosocial Re-Evaluation):  Psychosocial Re-Evaluation - 05/16/24 1120       Psychosocial Re-Evaluation   Current issues with None Identified    Comments Neale has not voiced any concerns or stressor the few times he has been at cardiac rehab.    Interventions Encouraged to attend Cardiac Rehabilitation for the exercise    Continue Psychosocial Services  No Follow up required          Vocational Rehabilitation: Provide vocational rehab assistance to qualifying candidates.   Vocational Rehab Evaluation & Intervention:  Vocational Rehab - 04/02/24 0830        Initial Vocational Rehab Evaluation & Intervention   Assessment shows need for Vocational Rehabilitation No   Dillian is not working currently due his recent cardiac event. Demond hopes to be cleared to return to his current job         Education: Education Goals: Education classes will be provided on a weekly basis, covering required topics. Participant will state understanding/return demonstration of topics presented.    Education     Row Name 04/22/24 0900     Education   Cardiac Education Topics --   Select --     Hospital Doctor --   Weekly Topic --      Core Videos: Exercise    Move It!  Clinical staff conducted group or individual video education with verbal and written  material and guidebook.  Patient learns the recommended Pritikin exercise program. Exercise with the goal of living a long, healthy life. Some of the health benefits of exercise include controlled diabetes, healthier blood pressure levels, improved cholesterol levels, improved heart and lung capacity, improved sleep, and better body composition. Everyone should speak with their doctor before starting or changing an exercise routine.  Biomechanical Limitations Clinical staff conducted group or individual video education with verbal and written material and guidebook.  Patient learns how biomechanical limitations can impact exercise and how we can mitigate and possibly overcome limitations to have an impactful and balanced exercise routine.  Body Composition Clinical staff conducted group or individual video education with verbal and written material and guidebook.  Patient learns that body composition (ratio of muscle mass to fat mass) is a key component to assessing overall fitness, rather than body weight alone. Increased fat mass, especially visceral belly fat, can put us  at increased risk for metabolic syndrome, type 2 diabetes, heart disease, and even death. It is recommended to combine  diet and exercise (cardiovascular and resistance training) to improve your body composition. Seek guidance from your physician and exercise physiologist before implementing an exercise routine.  Exercise Action Plan Clinical staff conducted group or individual video education with verbal and written material and guidebook.  Patient learns the recommended strategies to achieve and enjoy long-term exercise adherence, including variety, self-motivation, self-efficacy, and positive decision making. Benefits of exercise include fitness, good health, weight management, more energy, better sleep, less stress, and overall well-being.  Medical   Heart Disease Risk Reduction Clinical staff conducted group or individual video education with verbal and written material and guidebook.  Patient learns our heart is our most vital organ as it circulates oxygen, nutrients, white blood cells, and hormones throughout the entire body, and carries waste away. Data supports a plant-based eating plan like the Pritikin Program for its effectiveness in slowing progression of and reversing heart disease. The video provides a number of recommendations to address heart disease.   Metabolic Syndrome and Belly Fat  Clinical staff conducted group or individual video education with verbal and written material and guidebook.  Patient learns what metabolic syndrome is, how it leads to heart disease, and how one can reverse it and keep it from coming back. You have metabolic syndrome if you have 3 of the following 5 criteria: abdominal obesity, high blood pressure, high triglycerides, low HDL cholesterol, and high blood sugar.  Hypertension and Heart Disease Clinical staff conducted group or individual video education with verbal and written material and guidebook.  Patient learns that high blood pressure, or hypertension, is very common in the [place] . Hypertension is largely due to excessive salt intake, but other important  risk factors include being overweight, physical inactivity, drinking too much alcohol, smoking, and not eating enough potassium from fruits and vegetables. High blood pressure is a leading risk factor for heart attack, stroke, congestive heart failure, dementia, kidney failure, and premature death. Long-term effects of excessive salt intake include stiffening of the arteries and thickening of heart muscle and organ damage. Recommendations include ways to reduce hypertension and the risk of heart disease.  Diseases of Our Time - Focusing on Diabetes Clinical staff conducted group or individual video education with verbal and written material and guidebook.  Patient learns why the best way to stop diseases of our time is prevention, through food and other lifestyle changes. Medicine (such as prescription pills and surgeries) is often only a Band-Aid on  the problem, not a long-term solution. Most common diseases of our time include obesity, type 2 diabetes, hypertension, heart disease, and cancer. The Pritikin Program is recommended and has been proven to help reduce, reverse, and/or prevent the damaging effects of metabolic syndrome.  Nutrition   Overview of the Pritikin Eating Plan  Clinical staff conducted group or individual video education with verbal and written material and guidebook.  Patient learns about the Pritikin Eating Plan for disease risk reduction. The Pritikin Eating Plan emphasizes a wide variety of unrefined, minimally-processed carbohydrates, like fruits, vegetables, whole grains, and legumes. Go, Caution, and Stop food choices are explained. Plant-based and lean animal proteins are emphasized. Rationale provided for low sodium intake for blood pressure control, low added sugars for blood sugar stabilization, and low added fats and oils for coronary artery disease risk reduction and weight management.  Calorie Density  Clinical staff conducted group or individual video education with  verbal and written material and guidebook.  Patient learns about calorie density and how it impacts the Pritikin Eating Plan. Knowing the characteristics of the food you choose will help you decide whether those foods will lead to weight gain or weight loss, and whether you want to consume more or less of them. Weight loss is usually a side effect of the Pritikin Eating Plan because of its focus on low calorie-dense foods.  Label Reading  Clinical staff conducted group or individual video education with verbal and written material and guidebook.  Patient learns about the Pritikin recommended label reading guidelines and corresponding recommendations regarding calorie density, added sugars, sodium content, and whole grains.  Dining Out - Part 1  Clinical staff conducted group or individual video education with verbal and written material and guidebook.  Patient learns that restaurant meals can be sabotaging because they can be so high in calories, fat, sodium, and/or sugar. Patient learns recommended strategies on how to positively address this and avoid unhealthy pitfalls.  Facts on Fats  Clinical staff conducted group or individual video education with verbal and written material and guidebook.  Patient learns that lifestyle modifications can be just as effective, if not more so, as many medications for lowering your risk of heart disease. A Pritikin lifestyle can help to reduce your risk of inflammation and atherosclerosis (cholesterol build-up, or plaque, in the artery walls). Lifestyle interventions such as dietary choices and physical activity address the cause of atherosclerosis. A review of the types of fats and their impact on blood cholesterol levels, along with dietary recommendations to reduce fat intake is also included.  Nutrition Action Plan  Clinical staff conducted group or individual video education with verbal and written material and guidebook.  Patient learns how to incorporate  Pritikin recommendations into their lifestyle. Recommendations include planning and keeping personal health goals in mind as an important part of their success.  Healthy Mind-Set    Healthy Minds, Bodies, Hearts  Clinical staff conducted group or individual video education with verbal and written material and guidebook.  Patient learns how to identify when they are stressed. Video will discuss the impact of that stress, as well as the many benefits of stress management. Patient will also be introduced to stress management techniques. The way we think, act, and feel has an impact on our hearts.  How Our Thoughts Can Heal Our Hearts  Clinical staff conducted group or individual video education with verbal and written material and guidebook.  Patient learns that negative thoughts can cause depression and anxiety. This can  result in negative lifestyle behavior and serious health problems. Cognitive behavioral therapy is an effective method to help control our thoughts in order to change and improve our emotional outlook.  Additional Videos:  Exercise    Improving Performance  Clinical staff conducted group or individual video education with verbal and written material and guidebook.  Patient learns to use a non-linear approach by alternating intensity levels and lengths of time spent exercising to help burn more calories and lose more body fat. Cardiovascular exercise helps improve heart health, metabolism, hormonal balance, blood sugar control, and recovery from fatigue. Resistance training improves strength, endurance, balance, coordination, reaction time, metabolism, and muscle mass. Flexibility exercise improves circulation, posture, and balance. Seek guidance from your physician and exercise physiologist before implementing an exercise routine and learn your capabilities and proper form for all exercise.  Introduction to Yoga  Clinical staff conducted group or individual video education with  verbal and written material and guidebook.  Patient learns about yoga, a discipline of the coming together of mind, breath, and body. The benefits of yoga include improved flexibility, improved range of motion, better posture and core strength, increased lung function, weight loss, and positive self-image. Yogas heart health benefits include lowered blood pressure, healthier heart rate, decreased cholesterol and triglyceride levels, improved immune function, and reduced stress. Seek guidance from your physician and exercise physiologist before implementing an exercise routine and learn your capabilities and proper form for all exercise.  Medical   Aging: Enhancing Your Quality of Life  Clinical staff conducted group or individual video education with verbal and written material and guidebook.  Patient learns key strategies and recommendations to stay in good physical health and enhance quality of life, such as prevention strategies, having an advocate, securing a Health Care Proxy and Power of Attorney, and keeping a list of medications and system for tracking them. It also discusses how to avoid risk for bone loss.  Biology of Weight Control  Clinical staff conducted group or individual video education with verbal and written material and guidebook.  Patient learns that weight gain occurs because we consume more calories than we burn (eating more, moving less). Even if your body weight is normal, you may have higher ratios of fat compared to muscle mass. Too much body fat puts you at increased risk for cardiovascular disease, heart attack, stroke, type 2 diabetes, and obesity-related cancers. In addition to exercise, following the Pritikin Eating Plan can help reduce your risk.  Decoding Lab Results  Clinical staff conducted group or individual video education with verbal and written material and guidebook.  Patient learns that lab test reflects one measurement whose values change over time and are  influenced by many factors, including medication, stress, sleep, exercise, food, hydration, pre-existing medical conditions, and more. It is recommended to use the knowledge from this video to become more involved with your lab results and evaluate your numbers to speak with your doctor.   Diseases of Our Time - Overview  Clinical staff conducted group or individual video education with verbal and written material and guidebook.  Patient learns that according to the CDC, 50% to 70% of chronic diseases (such as obesity, type 2 diabetes, elevated lipids, hypertension, and heart disease) are avoidable through lifestyle improvements including healthier food choices, listening to satiety cues, and increased physical activity.  Sleep Disorders Clinical staff conducted group or individual video education with verbal and written material and guidebook.  Patient learns how good quality and duration of sleep are important  to overall health and well-being. Patient also learns about sleep disorders and how they impact health along with recommendations to address them, including discussing with a physician.  Nutrition  Dining Out - Part 2 Clinical staff conducted group or individual video education with verbal and written material and guidebook.  Patient learns how to plan ahead and communicate in order to maximize their dining experience in a healthy and nutritious manner. Included are recommended food choices based on the type of restaurant the patient is visiting.   Fueling a Banker conducted group or individual video education with verbal and written material and guidebook.  There is a strong connection between our food choices and our health. Diseases like obesity and type 2 diabetes are very prevalent and are in large-part due to lifestyle choices. The Pritikin Eating Plan provides plenty of food and hunger-curbing satisfaction. It is easy to follow, affordable, and helps reduce  health risks.  Menu Workshop  Clinical staff conducted group or individual video education with verbal and written material and guidebook.  Patient learns that restaurant meals can sabotage health goals because they are often packed with calories, fat, sodium, and sugar. Recommendations include strategies to plan ahead and to communicate with the manager, chef, or server to help order a healthier meal.  Planning Your Eating Strategy  Clinical staff conducted group or individual video education with verbal and written material and guidebook.  Patient learns about the Pritikin Eating Plan and its benefit of reducing the risk of disease. The Pritikin Eating Plan does not focus on calories. Instead, it emphasizes high-quality, nutrient-rich foods. By knowing the characteristics of the foods, we choose, we can determine their calorie density and make informed decisions.  Targeting Your Nutrition Priorities  Clinical staff conducted group or individual video education with verbal and written material and guidebook.  Patient learns that lifestyle habits have a tremendous impact on disease risk and progression. This video provides eating and physical activity recommendations based on your personal health goals, such as reducing LDL cholesterol, losing weight, preventing or controlling type 2 diabetes, and reducing high blood pressure.  Vitamins and Minerals  Clinical staff conducted group or individual video education with verbal and written material and guidebook.  Patient learns different ways to obtain key vitamins and minerals, including through a recommended healthy diet. It is important to discuss all supplements you take with your doctor.   Healthy Mind-Set    Smoking Cessation  Clinical staff conducted group or individual video education with verbal and written material and guidebook.  Patient learns that cigarette smoking and tobacco addiction pose a serious health risk which affects millions  of people. Stopping smoking will significantly reduce the risk of heart disease, lung disease, and many forms of cancer. Recommended strategies for quitting are covered, including working with your doctor to develop a successful plan.  Culinary   Becoming a Set Designer conducted group or individual video education with verbal and written material and guidebook.  Patient learns that cooking at home can be healthy, cost-effective, quick, and puts them in control. Keys to cooking healthy recipes will include looking at your recipe, assessing your equipment needs, planning ahead, making it simple, choosing cost-effective seasonal ingredients, and limiting the use of added fats, salts, and sugars.  Cooking - Breakfast and Snacks  Clinical staff conducted group or individual video education with verbal and written material and guidebook.  Patient learns how important breakfast is to satiety and nutrition through  the entire day. Recommendations include key foods to eat during breakfast to help stabilize blood sugar levels and to prevent overeating at meals later in the day. Planning ahead is also a key component.  Cooking - Educational Psychologist conducted group or individual video education with verbal and written material and guidebook.  Patient learns eating strategies to improve overall health, including an approach to cook more at home. Recommendations include thinking of animal protein as a side on your plate rather than center stage and focusing instead on lower calorie dense options like vegetables, fruits, whole grains, and plant-based proteins, such as beans. Making sauces in large quantities to freeze for later and leaving the skin on your vegetables are also recommended to maximize your experience.  Cooking - Healthy Salads and Dressing Clinical staff conducted group or individual video education with verbal and written material and guidebook.  Patient learns that  vegetables, fruits, whole grains, and legumes are the foundations of the Pritikin Eating Plan. Recommendations include how to incorporate each of these in flavorful and healthy salads, and how to create homemade salad dressings. Proper handling of ingredients is also covered. Cooking - Soups and [employer] - Soups and Desserts Clinical staff conducted group or individual video education with verbal and written material and guidebook.  Patient learns that Pritikin soups and desserts make for easy, nutritious, and delicious snacks and meal components that are low in sodium, fat, sugar, and calorie density, while high in vitamins, minerals, and filling fiber. Recommendations include simple and healthy ideas for soups and desserts.   Overview     The Pritikin Solution Program Overview Clinical staff conducted group or individual video education with verbal and written material and guidebook.  Patient learns that the results of the Pritikin Program have been documented in more than 100 articles published in peer-reviewed journals, and the benefits include reducing risk factors for (and, in some cases, even reversing) high cholesterol, high blood pressure, type 2 diabetes, obesity, and more! An overview of the three key pillars of the Pritikin Program will be covered: eating well, doing regular exercise, and having a healthy mind-set.  WORKSHOPS  Exercise: Exercise Basics: Building Your Action Plan Clinical staff led group instruction and group discussion with PowerPoint presentation and patient guidebook. To enhance the learning environment the use of posters, models and videos may be added. At the conclusion of this workshop, patients will comprehend the difference between physical activity and exercise, as well as the benefits of incorporating both, into their routine. Patients will understand the FITT (Frequency, Intensity, Time, and Type) principle and how to use it to build an exercise action  plan. In addition, safety concerns and other considerations for exercise and cardiac rehab will be addressed by the presenter. The purpose of this lesson is to promote a comprehensive and effective weekly exercise routine in order to improve patients overall level of fitness.   Managing Heart Disease: Your Path to a Healthier Heart Clinical staff led group instruction and group discussion with PowerPoint presentation and patient guidebook. To enhance the learning environment the use of posters, models and videos may be added.At the conclusion of this workshop, patients will understand the anatomy and physiology of the heart. Additionally, they will understand how Pritikins three pillars impact the risk factors, the progression, and the management of heart disease.  The purpose of this lesson is to provide a high-level overview of the heart, heart disease, and how the Pritikin lifestyle positively impacts risk  factors.  Exercise Biomechanics Clinical staff led group instruction and group discussion with PowerPoint presentation and patient guidebook. To enhance the learning environment the use of posters, models and videos may be added. Patients will learn how the structural parts of their bodies function and how these functions impact their daily activities, movement, and exercise. Patients will learn how to promote a neutral spine, learn how to manage pain, and identify ways to improve their physical movement in order to promote healthy living. The purpose of this lesson is to expose patients to common physical limitations that impact physical activity. Participants will learn practical ways to adapt and manage aches and pains, and to minimize their effect on regular exercise. Patients will learn how to maintain good posture while sitting, walking, and lifting.  Balance Training and Fall Prevention  Clinical staff led group instruction and group discussion with PowerPoint presentation and  patient guidebook. To enhance the learning environment the use of posters, models and videos may be added. At the conclusion of this workshop, patients will understand the importance of their sensorimotor skills (vision, proprioception, and the vestibular system) in maintaining their ability to balance as they age. Patients will apply a variety of balancing exercises that are appropriate for their current level of function. Patients will understand the common causes for poor balance, possible solutions to these problems, and ways to modify their physical environment in order to minimize their fall risk. The purpose of this lesson is to teach patients about the importance of maintaining balance as they age and ways to minimize their risk of falling.  WORKSHOPS   Nutrition:  Fueling a Ship Broker led group instruction and group discussion with PowerPoint presentation and patient guidebook. To enhance the learning environment the use of posters, models and videos may be added. Patients will review the foundational principles of the Pritikin Eating Plan and understand what constitutes a serving size in each of the food groups. Patients will also learn Pritikin-friendly foods that are better choices when away from home and review make-ahead meal and snack options. Calorie density will be reviewed and applied to three nutrition priorities: weight maintenance, weight loss, and weight gain. The purpose of this lesson is to reinforce (in a group setting) the key concepts around what patients are recommended to eat and how to apply these guidelines when away from home by planning and selecting Pritikin-friendly options. Patients will understand how calorie density may be adjusted for different weight management goals.  Mindful Eating  Clinical staff led group instruction and group discussion with PowerPoint presentation and patient guidebook. To enhance the learning environment the use of posters,  models and videos may be added. Patients will briefly review the concepts of the Pritikin Eating Plan and the importance of low-calorie dense foods. The concept of mindful eating will be introduced as well as the importance of paying attention to internal hunger signals. Triggers for non-hunger eating and techniques for dealing with triggers will be explored. The purpose of this lesson is to provide patients with the opportunity to review the basic principles of the Pritikin Eating Plan, discuss the value of eating mindfully and how to measure internal cues of hunger and fullness using the Hunger Scale. Patients will also discuss reasons for non-hunger eating and learn strategies to use for controlling emotional eating.  Targeting Your Nutrition Priorities Clinical staff led group instruction and group discussion with PowerPoint presentation and patient guidebook. To enhance the learning environment the use of posters, models and videos  may be added. Patients will learn how to determine their genetic susceptibility to disease by reviewing their family history. Patients will gain insight into the importance of diet as part of an overall healthy lifestyle in mitigating the impact of genetics and other environmental insults. The purpose of this lesson is to provide patients with the opportunity to assess their personal nutrition priorities by looking at their family history, their own health history and current risk factors. Patients will also be able to discuss ways of prioritizing and modifying the Pritikin Eating Plan for their highest risk areas  Menu  Clinical staff led group instruction and group discussion with PowerPoint presentation and patient guidebook. To enhance the learning environment the use of posters, models and videos may be added. Using menus brought in from [employer], or printed from [employer], patients will apply the Pritikin dining out guidelines that were presented in the  [employer] video. Patients will also be able to practice these guidelines in a variety of provided scenarios. The purpose of this lesson is to provide patients with the opportunity to practice hands-on learning of the Pritikin Dining Out guidelines with actual menus and practice scenarios.  Label Reading Clinical staff led group instruction and group discussion with PowerPoint presentation and patient guidebook. To enhance the learning environment the use of posters, models and videos may be added. Patients will review and discuss the Pritikin label reading guidelines presented in Pritikins Label Reading Educational series video. Using fool labels brought in from local grocery stores and markets, patients will apply the label reading guidelines and determine if the packaged food meet the Pritikin guidelines. The purpose of this lesson is to provide patients with the opportunity to review, discuss, and practice hands-on learning of the Pritikin Label Reading guidelines with actual packaged food labels. Cooking School  Pritikins [employer] are designed to teach patients ways to prepare quick, simple, and affordable recipes at home. The importance of nutritions role in chronic disease risk reduction is reflected in its emphasis in the overall Pritikin program. By learning how to prepare essential core Pritikin Eating Plan recipes, patients will increase control over what they eat; be able to customize the flavor of foods without the use of added salt, sugar, or fat; and improve the quality of the food they consume. By learning a set of core recipes which are easily assembled, quickly prepared, and affordable, patients are more likely to prepare more healthy foods at home. These workshops focus on convenient breakfasts, simple entres, side dishes, and desserts which can be prepared with minimal effort and are consistent with nutrition recommendations for cardiovascular risk  reduction. Cooking [employer] are taught by a armed forces logistics/support/administrative officer (RD) who has been trained by the [employer]. The chef or RD has a clear understanding of the importance of minimizing - if not completely eliminating - added fat, sugar, and sodium in recipes. Throughout the series of Cooking School Workshop sessions, patients will learn about healthy ingredients and efficient methods of cooking to build confidence in their capability to prepare    Cooking School weekly topics:  Adding Flavor- Sodium-Free  Fast and Healthy Breakfasts  Powerhouse Plant-Based Proteins  Satisfying Salads and Dressings  Simple Sides and Sauces  International Cuisine-Spotlight on the [employer] Zones  Delicious Desserts  Savory Soups  [employer] - Meals in a Astronomer Appetizers and Snacks  Comforting Weekend Breakfasts  One-Pot Wonders   Fast Evening Meals  Easy  Entertaining  Personalizing Your Pritikin Plate  WORKSHOPS   Healthy Mindset (Psychosocial):  Focused Goals, Sustainable Changes Clinical staff led group instruction and group discussion with PowerPoint presentation and patient guidebook. To enhance the learning environment the use of posters, models and videos may be added. Patients will be able to apply effective goal setting strategies to establish at least one personal goal, and then take consistent, meaningful action toward that goal. They will learn to identify common barriers to achieving personal goals and develop strategies to overcome them. Patients will also gain an understanding of how our mind-set can impact our ability to achieve goals and the importance of cultivating a positive and growth-oriented mind-set. The purpose of this lesson is to provide patients with a deeper understanding of how to set and achieve personal goals, as well as the tools and strategies needed to overcome common obstacles which may arise along the way.  From Head to Heart: The  Power of a Healthy Outlook  Clinical staff led group instruction and group discussion with PowerPoint presentation and patient guidebook. To enhance the learning environment the use of posters, models and videos may be added. Patients will be able to recognize and describe the impact of emotions and mood on physical health. They will discover the importance of self-care and explore self-care practices which may work for them. Patients will also learn how to utilize the 4 Cs to cultivate a healthier outlook and better manage stress and challenges. The purpose of this lesson is to demonstrate to patients how a healthy outlook is an essential part of maintaining good health, especially as they continue their cardiac rehab journey.  Healthy Sleep for a Healthy Heart Clinical staff led group instruction and group discussion with PowerPoint presentation and patient guidebook. To enhance the learning environment the use of posters, models and videos may be added. At the conclusion of this workshop, patients will be able to demonstrate knowledge of the importance of sleep to overall health, well-being, and quality of life. They will understand the symptoms of, and treatments for, common sleep disorders. Patients will also be able to identify daytime and nighttime behaviors which impact sleep, and they will be able to apply these tools to help manage sleep-related challenges. The purpose of this lesson is to provide patients with a general overview of sleep and outline the importance of quality sleep. Patients will learn about a few of the most common sleep disorders. Patients will also be introduced to the concept of sleep hygiene, and discover ways to self-manage certain sleeping problems through simple daily behavior changes. Finally, the workshop will motivate patients by clarifying the links between quality sleep and their goals of heart-healthy living.   Recognizing and Reducing Stress Clinical staff led  group instruction and group discussion with PowerPoint presentation and patient guidebook. To enhance the learning environment the use of posters, models and videos may be added. At the conclusion of this workshop, patients will be able to understand the types of stress reactions, differentiate between acute and chronic stress, and recognize the impact that chronic stress has on their health. They will also be able to apply different coping mechanisms, such as reframing negative self-talk. Patients will have the opportunity to practice a variety of stress management techniques, such as deep abdominal breathing, progressive muscle relaxation, and/or guided imagery.  The purpose of this lesson is to educate patients on the role of stress in their lives and to provide healthy techniques for coping with it.  Learning Barriers/Preferences:  Learning Barriers/Preferences - 04/02/24 0845       Learning Barriers/Preferences   Learning Barriers Exercise Concerns   Sometimes dizzy   Learning Preferences Computer/Internet;Group Instruction;Individual Instruction;Pictoral;Skilled Demonstration;Video;Written Material          Education Topics:  Knowledge Questionnaire Score:  Knowledge Questionnaire Score - 04/02/24 0854       Knowledge Questionnaire Score   Pre Score 22/28          Core Components/Risk Factors/Patient Goals at Admission:  Personal Goals and Risk Factors at Admission - 04/02/24 0848       Core Components/Risk Factors/Patient Goals on Admission    Weight Management Yes;Weight Gain    Intervention Weight Management: Develop a combined nutrition and exercise program designed to reach desired caloric intake, while maintaining appropriate intake of nutrient and fiber, sodium and fats, and appropriate energy expenditure required for the weight goal.;Weight Management: Provide education and appropriate resources to help participant work on and attain dietary goals.    Admit Weight 134  lb 14.7 oz (61.2 kg)    Expected Outcomes Short Term: Continue to assess and modify interventions until short term weight is achieved;Long Term: Adherence to nutrition and physical activity/exercise program aimed toward attainment of established weight goal;Weight Loss: Understanding of general recommendations for a balanced deficit meal plan, which promotes 1-2 lb weight loss per week and includes a negative energy balance of [phone] kcal/d;Understanding recommendations for meals to include 15-35% energy as protein, 25-35% energy from fat, 35-60% energy from carbohydrates, less than 200mg  of dietary cholesterol, 20-35 gm of total fiber daily;Understanding of distribution of calorie intake throughout the day with the consumption of 4-5 meals/snacks    Improve shortness of breath with ADL's Yes    Intervention Provide education, individualized exercise plan and daily activity instruction to help decrease symptoms of SOB with activities of daily living.    Expected Outcomes Short Term: Improve cardiorespiratory fitness to achieve a reduction of symptoms when performing ADLs;Long Term: Be able to perform more ADLs without symptoms or delay the onset of symptoms    Hypertension Yes    Intervention Provide education on lifestyle modifcations including regular physical activity/exercise, weight management, moderate sodium restriction and increased consumption of fresh fruit, vegetables, and low fat dairy, alcohol moderation, and smoking cessation.;Monitor prescription use compliance.    Expected Outcomes Short Term: Continued assessment and intervention until BP is < 140/57mm HG in hypertensive participants. < 130/3mm HG in hypertensive participants with diabetes, heart failure or chronic kidney disease.;Long Term: Maintenance of blood pressure at goal levels.    Stress Yes    Intervention Offer individual and/or small group education and counseling on adjustment to heart disease, stress management and  health-related lifestyle change. Teach and support self-help strategies.;Refer participants experiencing significant psychosocial distress to appropriate mental health specialists for further evaluation and treatment. When possible, include family members and significant others in education/counseling sessions.    Expected Outcomes Short Term: Participant demonstrates changes in health-related behavior, relaxation and other stress management skills, ability to obtain effective social support, and compliance with psychotropic medications if prescribed.;Long Term: Emotional wellbeing is indicated by absence of clinically significant psychosocial distress or social isolation.    Personal Goal Other Yes    Personal Goal ST: eat better, more veggies LT weight gain, control SOB, increase strength    Intervention Will continue to monitor pt and increase WL's as tolerated without sign or symptom    Expected Outcomes Pt will achieve his goals  Core Components/Risk Factors/Patient Goals Review:   Goals and Risk Factor Review     Row Name 04/10/24 1700 04/21/24 0740 05/16/24 1120         Core Components/Risk Factors/Patient Goals Review   Personal Goals Review Weight Management/Obesity;Tobacco Cessation;Hypertension;Lipids;Stress Weight Management/Obesity;Tobacco Cessation;Hypertension;Lipids;Stress Weight Management/Obesity;Tobacco Cessation;Hypertension;Lipids;Stress     Review Ildefonso started cardiac rehab on 11/21.25. Jag did fair with exercise for his fitness level. Some resting and exertional BP elevations noted. Spot checked CBG's. Katrina admits he needs to make some dietary changes. encouraged smoking cessation. Travers started cardiac rehab on 11/21.25. Gavynn has only attended one exercise session. Isaiah attendance has been poor. Omaree started cardiac rehab on 04/10/24. Bertrand has attended a few exercise sessions. Jorje attendance has remains poor. Vital signs have been stable. Psalm continues  to smoke and does not intend to stop at this time     Expected Outcomes Michaelanthony will continue to participate in cardiac rehab for exercise, nutrition and lifestyle modifications. Leny will continue to participate in cardiac rehab for exercise, nutrition and lifestyle modifications when in attendance. Karan will continue to participate in cardiac rehab for exercise, nutrition and lifestyle modifications when in attendance.        Core Components/Risk Factors/Patient Goals at Discharge (Final Review):   Goals and Risk Factor Review - 05/16/24 1120       Core Components/Risk Factors/Patient Goals Review   Personal Goals Review Weight Management/Obesity;Tobacco Cessation;Hypertension;Lipids;Stress    Review Seon started cardiac rehab on 04/10/24. Trevaris has attended a few exercise sessions. Amiir attendance has remains poor. Vital signs have been stable. Nyshawn continues to smoke and does not intend to stop at this time    Expected Outcomes Allard will continue to participate in cardiac rehab for exercise, nutrition and lifestyle modifications when in attendance.          ITP Comments:  ITP Comments     Row Name 03/24/24 9076 04/02/24 0815 04/10/24 1657 04/21/24 0737 05/16/24 1117   ITP Comments Dr Wilbert Bihari MD, Medical Director Dr. Wilbert Bihari medical director. Introduction to pritikin education/intensive cardiac rehab. Initial orientation packet reviewed with patient. 30 Day ITP Rondel started cardiac rehab on 04/10/24. Artemio did well with exercise for his fitness level. Charan did experience some lightheadedness toward the end of exercise. Resolved with rest. 30 Day ITP Magnus has only attended one exercise session at cardiac rehab. Brayen attendance is poor. 30 Day ITP Review.  Esdras attendance remains poor. Arhum has attendended 6 exercise and education classes since attending orientation on 04/02/24      Comments: See ITP comments.[name] RN BSN      [1]  Current  Outpatient Medications:    aspirin  EC 81 MG tablet, Take 1 tablet (81 mg total) by mouth daily. Swallow whole., Disp: 90 tablet, Rfl: 3   atorvastatin  (LIPITOR) 80 MG tablet, Take 1 tablet (80 mg total) by mouth daily., Disp: 90 tablet, Rfl: 1   carvedilol  (COREG ) 3.125 MG tablet, Take 1 tablet (3.125 mg total) by mouth 2 (two) times daily with a meal., Disp: 180 tablet, Rfl: 2   digoxin  (LANOXIN ) 0.125 MG tablet, Take 1 tablet (0.125 mg total) by mouth daily., Disp: 90 tablet, Rfl: 3   nitroGLYCERIN  (NITROSTAT ) 0.4 MG SL tablet, Place 1 tablet (0.4 mg total) under the tongue every 5 (five) minutes x 3 doses as needed for chest pain., Disp: 25 tablet, Rfl: 2   prasugrel  (EFFIENT ) 10 MG TABS tablet, Take 1 tablet (10 mg total)  by mouth daily., Disp: 90 tablet, Rfl: 3   sacubitril -valsartan  (ENTRESTO ) 24-26 MG, Take 1 tablet by mouth 2 (two) times daily., Disp: 60 tablet, Rfl: 11   spironolactone  (ALDACTONE ) 25 MG tablet, Take 1/2 tablet (12.5 mg total) by mouth daily. (Patient taking differently: Take 25 mg by mouth daily.), Disp: 45 tablet, Rfl: 3 [2]  Social History Tobacco Use  Smoking Status Every Day   Types: Cigarettes  Smokeless Tobacco Never  Tobacco Comments   Info given

## 2024-05-18 ENCOUNTER — Encounter (HOSPITAL_COMMUNITY): Admission: RE | Admit: 2024-05-18 | Source: Ambulatory Visit

## 2024-05-18 ENCOUNTER — Encounter (HOSPITAL_COMMUNITY)

## 2024-05-20 ENCOUNTER — Encounter (HOSPITAL_COMMUNITY)

## 2024-05-22 ENCOUNTER — Encounter (HOSPITAL_COMMUNITY)

## 2024-05-22 ENCOUNTER — Telehealth (HOSPITAL_COMMUNITY): Payer: Self-pay

## 2024-05-22 NOTE — Telephone Encounter (Signed)
 Attempted to call pt regarding his 5x no call/no show.  Last attended cardiac rehab session was 05/08/24.  Encouraged pt to continue with his rehab if he is able and to call us  back and let us  know if he will be continuing with cardiac rehab.

## 2024-05-22 NOTE — Telephone Encounter (Signed)
 Duplicate note

## 2024-05-25 ENCOUNTER — Encounter (HOSPITAL_COMMUNITY)

## 2024-05-27 ENCOUNTER — Encounter (HOSPITAL_COMMUNITY): Admission: RE | Admit: 2024-05-27 | Source: Ambulatory Visit

## 2024-05-27 ENCOUNTER — Telehealth (HOSPITAL_COMMUNITY): Payer: Self-pay | Admitting: *Deleted

## 2024-05-27 ENCOUNTER — Encounter (HOSPITAL_COMMUNITY)

## 2024-05-27 NOTE — Telephone Encounter (Signed)
 Spoke with Joseph Hess he has returned to work. Will discharge. [name] attended 6 exercise sessions between 04/02/24-05/08/24.[name] RN BSN

## 2024-05-29 ENCOUNTER — Encounter (HOSPITAL_COMMUNITY)

## 2024-06-01 ENCOUNTER — Encounter (HOSPITAL_COMMUNITY)

## 2024-06-02 ENCOUNTER — Other Ambulatory Visit (HOSPITAL_COMMUNITY): Payer: Self-pay

## 2024-06-02 MED ORDER — BUPROPION HCL ER (SR) 150 MG PO TB12
150.0000 mg | ORAL_TABLET | Freq: Two times a day (BID) | ORAL | 0 refills | Status: AC
  Filled 2024-06-02: qty 180, 90d supply, fill #0

## 2024-06-02 MED ORDER — METFORMIN HCL ER 500 MG PO TB24
500.0000 mg | ORAL_TABLET | Freq: Two times a day (BID) | ORAL | 0 refills | Status: AC
  Filled 2024-06-02: qty 180, 90d supply, fill #0

## 2024-06-03 ENCOUNTER — Encounter (HOSPITAL_COMMUNITY)

## 2024-06-05 ENCOUNTER — Encounter (HOSPITAL_COMMUNITY)

## 2024-06-08 ENCOUNTER — Encounter (HOSPITAL_COMMUNITY)

## 2024-06-08 ENCOUNTER — Other Ambulatory Visit (HOSPITAL_COMMUNITY): Payer: Self-pay

## 2024-06-10 ENCOUNTER — Encounter (HOSPITAL_COMMUNITY)

## 2024-06-12 ENCOUNTER — Encounter (HOSPITAL_COMMUNITY)

## 2024-06-12 ENCOUNTER — Other Ambulatory Visit (HOSPITAL_COMMUNITY): Payer: Self-pay

## 2024-06-15 ENCOUNTER — Encounter (HOSPITAL_COMMUNITY)

## 2024-06-17 ENCOUNTER — Encounter (HOSPITAL_COMMUNITY)

## 2024-06-19 ENCOUNTER — Encounter (HOSPITAL_COMMUNITY)

## 2024-06-22 ENCOUNTER — Encounter (HOSPITAL_COMMUNITY)

## 2024-06-24 ENCOUNTER — Encounter (HOSPITAL_COMMUNITY)

## 2024-06-26 ENCOUNTER — Encounter (HOSPITAL_COMMUNITY)
